# Patient Record
Sex: Female | Born: 1965 | Race: Black or African American | Hispanic: No | Smoking: Former smoker
Health system: Southern US, Community
[De-identification: ages and names within clinical notes are randomized; demographics above are authoritative.]

## PROBLEM LIST (undated history)

## (undated) DIAGNOSIS — G4700 Insomnia, unspecified: Secondary | ICD-10-CM

## (undated) DIAGNOSIS — F419 Anxiety disorder, unspecified: Secondary | ICD-10-CM

## (undated) DIAGNOSIS — M549 Dorsalgia, unspecified: Secondary | ICD-10-CM

## (undated) DIAGNOSIS — N809 Endometriosis, unspecified: Secondary | ICD-10-CM

## (undated) HISTORY — DX: Insomnia, unspecified: G47.00

## (undated) HISTORY — DX: Endometriosis, unspecified: N80.9

## (undated) HISTORY — PX: ABDOMINAL HYSTERECTOMY: SHX81

## (undated) HISTORY — PX: APPENDECTOMY: SHX54

## (undated) HISTORY — DX: Anxiety disorder, unspecified: F41.9

---

## 2007-03-15 ENCOUNTER — Emergency Department (HOSPITAL_COMMUNITY): Admission: EM | Admit: 2007-03-15 | Discharge: 2007-03-15 | Payer: Self-pay | Admitting: Emergency Medicine

## 2007-03-29 ENCOUNTER — Emergency Department (HOSPITAL_COMMUNITY): Admission: EM | Admit: 2007-03-29 | Discharge: 2007-03-29 | Payer: Self-pay | Admitting: Emergency Medicine

## 2007-04-22 ENCOUNTER — Emergency Department (HOSPITAL_COMMUNITY): Admission: EM | Admit: 2007-04-22 | Discharge: 2007-04-22 | Payer: Self-pay | Admitting: Emergency Medicine

## 2008-05-24 ENCOUNTER — Emergency Department (HOSPITAL_COMMUNITY): Admission: EM | Admit: 2008-05-24 | Discharge: 2008-05-24 | Payer: Self-pay | Admitting: Emergency Medicine

## 2008-06-27 ENCOUNTER — Emergency Department (HOSPITAL_COMMUNITY): Admission: EM | Admit: 2008-06-27 | Discharge: 2008-06-27 | Payer: Self-pay | Admitting: Emergency Medicine

## 2009-03-28 ENCOUNTER — Emergency Department (HOSPITAL_COMMUNITY): Admission: EM | Admit: 2009-03-28 | Discharge: 2009-03-28 | Payer: Self-pay | Admitting: Emergency Medicine

## 2011-01-14 ENCOUNTER — Other Ambulatory Visit: Payer: Self-pay | Admitting: Family Medicine

## 2011-01-14 DIAGNOSIS — Z1231 Encounter for screening mammogram for malignant neoplasm of breast: Secondary | ICD-10-CM

## 2011-01-21 ENCOUNTER — Ambulatory Visit (HOSPITAL_COMMUNITY): Payer: Self-pay | Attending: Family Medicine

## 2011-07-06 ENCOUNTER — Emergency Department (HOSPITAL_COMMUNITY)
Admission: EM | Admit: 2011-07-06 | Discharge: 2011-07-07 | Disposition: A | Discharge status: Home / Self Care | Payer: Self-pay | Attending: Emergency Medicine | Admitting: Emergency Medicine

## 2011-07-06 DIAGNOSIS — K089 Disorder of teeth and supporting structures, unspecified: Secondary | ICD-10-CM | POA: Insufficient documentation

## 2011-07-06 DIAGNOSIS — Z87891 Personal history of nicotine dependence: Secondary | ICD-10-CM | POA: Insufficient documentation

## 2012-01-19 ENCOUNTER — Ambulatory Visit: Payer: BC Managed Care – PPO

## 2012-01-19 ENCOUNTER — Ambulatory Visit (INDEPENDENT_AMBULATORY_CARE_PROVIDER_SITE_OTHER): Payer: BC Managed Care – PPO | Admitting: Physician Assistant

## 2012-01-19 DIAGNOSIS — H66009 Acute suppurative otitis media without spontaneous rupture of ear drum, unspecified ear: Secondary | ICD-10-CM

## 2012-01-19 DIAGNOSIS — H6690 Otitis media, unspecified, unspecified ear: Secondary | ICD-10-CM

## 2012-01-19 DIAGNOSIS — R091 Pleurisy: Secondary | ICD-10-CM

## 2012-01-19 MED ORDER — IBUPROFEN 600 MG PO TABS: 600.0000 mg | ORAL_TABLET | Freq: Four times a day (QID) | ORAL | Status: AC | PRN

## 2012-01-19 MED ORDER — AZITHROMYCIN 250 MG PO TABS: ORAL_TABLET | ORAL | Status: AC

## 2012-01-19 MED ORDER — HYDROCODONE-ACETAMINOPHEN 5-500 MG PO TABS: 1.0000 | ORAL_TABLET | Freq: Three times a day (TID) | ORAL | Status: AC | PRN

## 2012-01-19 NOTE — Progress Notes (Signed)
  Subjective:    Patient ID: Rebecca Grant, female    DOB: 03/11/1966, 46 y.o.   MRN: [id number]  HPI 46 y/o BF presents with three day history of left thoracic back pain.  No known injury.  Increased pain with inspiration and movement.  Denies dyspnea or wheeze.  No fever.  No recent travel.  No LE edema or pain.  Non smoker (quit 2012), not on OCPs.  S/p hysterectomy for fibroid tumors.  Had viral illness, presumed influenza, two weeks ago with cough and fever, now resolved.    Also with five day history of left sided ear pain.  Shooting pain, sensation of fullness.     Review of Systems  Constitutional: Positive for fatigue (ongoing since influenza dx two weeks ago). Negative for fever and chills.  HENT: Positive for ear pain. Negative for nosebleeds, congestion, rhinorrhea and tinnitus.   Respiratory: Negative for cough, shortness of breath and wheezing. Chest tightness: left thoracic back pain.   Cardiovascular: Negative for chest pain (left rear thoracic back pain) and palpitations.  Genitourinary: Negative for dysuria, frequency and flank pain.  Musculoskeletal: Positive for back pain (left rear thoracic).       Objective:   Physical Exam  Constitutional: She is oriented to person, place, and time. No distress (but with obvious discomfort on inspiration).  HENT:  Head: Normocephalic.  Left Ear: Tympanic membrane is bulging (multiple bullous lesions). A middle ear effusion (purulent) is present.  Neck: Normal range of motion.  Cardiovascular: Normal rate and regular rhythm.  Exam reveals no gallop and no friction rub.   No murmur heard. Pulmonary/Chest: No respiratory distress. She has no wheezes. She has no rales.  Musculoskeletal: She exhibits tenderness (left rear thoracic rib cage at T9-T11).  Neurological: She is alert and oriented to person, place, and time.  Skin: Skin is warm and dry.    CXR without obvious infiltrate or fracture.  No wedge seen.     Assessment & Plan:    Left OM and bullous myringitis.  Treat with azithromycin.  Ibuprofen prn pain. Pleuritic chest pain.  Viral illness resolving, no cough, normal pulse ox, stable BP and pulse.  Symptomatic and supportive care with ibuprofen 600mg  QID, heat and rest.

## 2012-01-19 NOTE — Patient Instructions (Signed)
Pleurisy  Pleurisy is an inflammation and swelling of the lining of the lungs. It usually is the result of an underlying infection or other disease. Because of this inflammation, it hurts to breathe. It is aggravated by coughing or deep breathing. The primary goal in treating pleurisy is to diagnose and treat the condition that caused it.   HOME CARE INSTRUCTIONS    Only take over-the-counter or prescription medicines for pain, discomfort, or fever as directed by your caregiver.   If medications which kill germs (antibiotics) were prescribed, take the entire course. Even if you are feeling better, you need to take them.   Use a cool mist vaporizer to help loosen secretions. This is so the secretions can be coughed up more easily.  SEEK MEDICAL CARE IF:    Your pain is not controlled with medication or is increasing.   You have an increase inpus like (purulent) secretions brought up with coughing.  SEEK IMMEDIATE MEDICAL CARE IF:    You have blue or dark lips, fingernails, or toenails.   You begin coughing up blood.   You have increased difficulty breathing.   You have continuing pain unrelieved by medicine or lasting more than 1 week.   You have pain that radiates into your neck, arms, or jaw.   You develop increased shortness of breath or wheezing.   You develop a fever, rash, vomiting, fainting, or other serious complaints.  Document Released: 10/04/2005 Document Revised: 09/23/2011 Document Reviewed: 05/05/2007  Exit[employer] Patient Information 2012 [employer].

## 2012-06-04 ENCOUNTER — Emergency Department (HOSPITAL_COMMUNITY)
Admission: EM | Admit: 2012-06-04 | Discharge: 2012-06-04 | Disposition: A | Discharge status: Home / Self Care | Payer: Self-pay | Attending: Emergency Medicine | Admitting: Emergency Medicine

## 2012-06-04 ENCOUNTER — Encounter (HOSPITAL_COMMUNITY): Payer: Self-pay | Admitting: *Deleted

## 2012-06-04 DIAGNOSIS — H9209 Otalgia, unspecified ear: Secondary | ICD-10-CM | POA: Insufficient documentation

## 2012-06-04 DIAGNOSIS — M26629 Arthralgia of temporomandibular joint, unspecified side: Secondary | ICD-10-CM

## 2012-06-04 DIAGNOSIS — H9202 Otalgia, left ear: Secondary | ICD-10-CM

## 2012-06-04 DIAGNOSIS — M26609 Unspecified temporomandibular joint disorder, unspecified side: Secondary | ICD-10-CM | POA: Insufficient documentation

## 2012-06-04 MED ORDER — ANTIPYRINE-BENZOCAINE 5.4-1.4 % OT SOLN
3.0000 [drp] | OTIC | Status: DC | PRN
  Administered 2012-06-04: 3 [drp] via OTIC
  Filled 2012-06-04: qty 10

## 2012-06-04 MED ORDER — HYDROCODONE-ACETAMINOPHEN 5-325 MG PO TABS
1.0000 | ORAL_TABLET | Freq: Once | ORAL | Status: AC
  Administered 2012-06-04: 1 via ORAL
  Filled 2012-06-04: qty 1

## 2012-06-04 MED ORDER — HYDROCODONE-ACETAMINOPHEN 5-325 MG PO TABS: 1.0000 | ORAL_TABLET | ORAL | Status: AC | PRN

## 2012-06-04 NOTE — ED Notes (Signed)
Pt c/o left earache/infection; was given amoxicillin about a wk ago and her ear is no better

## 2012-06-04 NOTE — ED Provider Notes (Signed)
History     CSN: [id number]  Arrival date & time 06/04/12  2016   First MD Initiated Contact with Patient 06/04/12 2151      Chief Complaint  Patient presents with  . Otalgia   HPI  History provided by the patient. Patient is a 46 year old female with no significant PMH who presents with complaints of persistent left ear pain. Patient states she has been having persistent pains the left ear for the past 5-6 days. Pain radiates into the left face and head. Patient was seen by PCP for these symptoms diagnosed with an infection and given prescriptions for amoxicillin and tramadol. Patient has also been using high-dose ibuprofen during the day for symptoms without any significant improvement. Patient states medicines are not helping with her pain symptoms. She denies any aggravating or alleviating factors. She denies any associated nasal congestion, rhinorrhea, cough, sore throat or dental pain.    History reviewed. No pertinent past medical history.  Past Surgical History  Procedure Date  . Abdominal hysterectomy     No family history on file.  History  Substance Use Topics  . Smoking status: Never Smoker   . Smokeless tobacco: Not on file  . Alcohol Use: Not on file    OB History    Grav Para Term Preterm Abortions TAB SAB Ect Mult Living                  Review of Systems  Constitutional: Negative for fever and chills.  HENT: Positive for ear pain. Negative for hearing loss, congestion, rhinorrhea, dental problem and tinnitus.   Respiratory: Negative for cough.   Gastrointestinal: Negative for nausea and vomiting.  Neurological: Positive for headaches.    Allergies  Review of patient's allergies indicates no known allergies.  Home Medications   Current Outpatient Rx  Name Route Sig Dispense Refill  . AMOXICILLIN 500 MG PO CAPS Oral Take 500 mg by mouth 3 (three) times daily.    . IBUPROFEN 800 MG PO TABS Oral Take 800 mg by mouth every 8 (eight) hours as needed.  pain    . TRAMADOL HCL 50 MG PO TABS Oral Take 50 mg by mouth every 6 (six) hours as needed. pain      BP 108/68  Pulse 93  Temp 98 F (36.7 C) (Oral)  Resp 20  SpO2 99%  Physical Exam  Nursing note and vitals reviewed. Constitutional: She is oriented to person, place, and time. She appears well-developed and well-nourished. No distress.  HENT:  Head: Normocephalic and atraumatic.  Right Ear: Tympanic membrane normal.  Left Ear: Tympanic membrane normal.  Mouth/Throat: Oropharynx is clear and moist.       Some pain with manipulation of left pinna.  There is also tenderness over left TMJ area with slight clicking. No pain over dentition. No oral swelling.  Neck: Normal range of motion. Neck supple.  Cardiovascular: Normal rate and regular rhythm.   Pulmonary/Chest: Effort normal and breath sounds normal.  Lymphadenopathy:    She has no cervical adenopathy.  Neurological: She is alert and oriented to person, place, and time.  Skin: Skin is warm and dry. No rash noted.  Psychiatric: She has a normal mood and affect. Her behavior is normal.    ED Course  Procedures     1. Otalgia of left ear   2. TMJ tenderness       MDM  10:00PM patient seen and evaluated. No impressive findings on examination of the year. Patient  is tender with manipulations of the pinna of ear.  Patient also has tenderness over left TMJ area. There is slight clicking. She denies increased pain or symptoms with opening and closing the jaw.  Patient given Auralgan eardrops as well as dose of Norco. She has been instructed to followup with her PCP tomorrow for continued evaluation and treatment.      Angus Seller, [place] 06/04/12 2253

## 2012-06-05 NOTE — ED Provider Notes (Signed)
Medical screening examination/treatment/procedure(s) were performed by non-physician practitioner and as supervising physician I was immediately available for consultation/collaboration.  Eagle Pitta, MD 06/05/12 0126 

## 2012-06-22 ENCOUNTER — Ambulatory Visit: Payer: BC Managed Care – PPO | Admitting: Emergency Medicine

## 2012-06-22 VITALS — BP 110/76 | HR 80 | Temp 97.8°F | Resp 18 | Ht 67.0 in | Wt 202.0 lb

## 2012-06-22 DIAGNOSIS — M26609 Unspecified temporomandibular joint disorder, unspecified side: Secondary | ICD-10-CM

## 2012-06-22 MED ORDER — NAPROXEN SODIUM 550 MG PO TABS: 550.0000 mg | ORAL_TABLET | Freq: Two times a day (BID) | ORAL | Status: DC

## 2012-06-22 MED ORDER — DIAZEPAM 2 MG PO TABS: 2.0000 mg | ORAL_TABLET | Freq: Four times a day (QID) | ORAL | Status: AC | PRN

## 2012-06-22 NOTE — Progress Notes (Signed)
   Date:  06/22/2012   Name:  Rebecca Grant   DOB:  11-04-1965   MRN:  [id number] Gender: female Age: 46 y.o.  PCP:  No primary provider on file.    Chief Complaint: Otalgia   History of Present Illness:  Rebecca Grant is a 46 y.o. pleasant patient who presents with the following:  Left ear pain and pain into left TMJ.  Seen in ER for same last month and put on vicodin and amoxicillin.  There was no improvement in her symptoms.  She has no nasal congestion, drainage or post nasal drip. Has no fever or chills, sore throat or cough.  Says the pain is worse at night and wakes her up.  She has some relief sitting up but the pain is spamodic and cycles so she wakes up frequently.  She feels as though she is gritting her teeth.  There is no problem list on file for this patient.   No past medical history on file.  Past Surgical History  Procedure Date  . Abdominal hysterectomy     History  Substance Use Topics  . Smoking status: Former Smoker -- 0.3 packs/day for 6 years    Quit date: 06/21/2011  . Smokeless tobacco: Never Used  . Alcohol Use: No    No family history on file.  No Known Allergies  Medication list has been reviewed and updated.  Current Outpatient Prescriptions on File Prior to Visit  Medication Sig Dispense Refill  . ibuprofen (ADVIL,MOTRIN) 800 MG tablet Take 800 mg by mouth every 8 (eight) hours as needed. pain      . traMADol (ULTRAM) 50 MG tablet Take 50 mg by mouth every 6 (six) hours as needed. pain        Review of Systems:  As per HPI, otherwise negative.    Physical Examination: Filed Vitals:   06/22/12 1432  BP: 110/76  Pulse: 80  Temp: 97.8 F (36.6 C)  Resp: 18   Filed Vitals:   06/22/12 1432  Height: 5\' 7"  (1.702 m)  Weight: 202 lb (91.627 kg)   Body mass index is 31.64 kg/(m^2). Ideal Body Weight: Weight in (lb) to have BMI = 25: 159.3    GEN: WDWN, NAD, Non-toxic, Alert & Oriented x 3 HEENT: Atraumatic, Normocephalic.   Tender  over left TMJ.  No crepitus or cellulitis.  Oropharynx negative Ears and Nose: No external deformity. TM negative. EXTR: No clubbing/cyanosis/edema NEURO: Normal gait.  PSYCH: Normally interactive. Conversant. Not depressed or anxious appearing.  Calm demeanor.  Neck supple   Assessment and Plan: TMJ dysfunction.  Mouth guard Follow up with dentist Anaprox valium  Carmelina Dane, MD

## 2013-01-15 ENCOUNTER — Ambulatory Visit (INDEPENDENT_AMBULATORY_CARE_PROVIDER_SITE_OTHER): Payer: BC Managed Care – PPO | Admitting: Emergency Medicine

## 2013-01-15 VITALS — BP 112/72 | HR 79 | Temp 98.1°F | Resp 16 | Ht 66.0 in | Wt 208.0 lb

## 2013-01-15 DIAGNOSIS — B372 Candidiasis of skin and nail: Secondary | ICD-10-CM

## 2013-01-15 MED ORDER — FLUCONAZOLE 100 MG PO TABS: ORAL_TABLET | ORAL | Status: DC

## 2013-01-15 NOTE — Patient Instructions (Addendum)
Candida Infection, Adult  A candida infection (also called yeast, fungus and Monilia infection) is an overgrowth of yeast that can occur anywhere on the body. A yeast infection commonly occurs in warm, moist body areas. Usually, the infection remains localized but can spread to become a systemic infection. A yeast infection may be a sign of a more severe disease such as diabetes, leukemia, or AIDS.  A yeast infection can occur in both men and women. In women, Candida vaginitis is a vaginal infection. It is one of the most common causes of vaginitis. Men usually do not have symptoms or know they have an infection until other problems develop. Men may find out they have a yeast infection because their sex partner has a yeast infection. Uncircumcised men are more likely to get a yeast infection than circumcised men. This is because the uncircumcised glans is not exposed to air and does not remain as dry as that of a circumcised glans. Older adults may develop yeast infections around dentures.  CAUSES   Women   Antibiotics.   Steroid medication taken for a long time.   Being overweight (obese).   Diabetes.   Poor immune condition.   Certain serious medical conditions.   Immune suppressive medications for organ transplant patients.   Chemotherapy.   Pregnancy.   Menstration.   Stress and fatigue.   Intravenous drug use.   Oral contraceptives.   Wearing tight-fitting clothes in the crotch area.   Catching it from a sex partner who has a yeast infection.   Spermicide.   Intravenous, urinary, or other catheters.  Men   Catching it from a sex partner who has a yeast infection.   Having oral or anal sex with a person who has the infection.   Spermicide.   Diabetes.   Antibiotics.   Poor immune system.   Medications that suppress the immune system.   Intravenous drug use.   Intravenous, urinary, or other catheters.  SYMPTOMS   Women   Thick, white vaginal discharge.   Vaginal itching.   Redness and  swelling in and around the vagina.   Irritation of the lips of the vagina and perineum.   Blisters on the vaginal lips and perineum.   Painful sexual intercourse.   Low blood sugar (hypoglycemia).   Painful urination.   Bladder infections.   Intestinal problems such as constipation, indigestion, bad breath, bloating, increase in gas, diarrhea, or loose stools.  Men   Men may develop intestinal problems such as constipation, indigestion, bad breath, bloating, increase in gas, diarrhea, or loose stools.   Dry, cracked skin on the penis with itching or discomfort.   Jock itch.   Dry, flaky skin.   Athlete's foot.   Hypoglycemia.  DIAGNOSIS   Women   A history and an exam are performed.   The discharge may be examined under a microscope.   A culture may be taken of the discharge.  Men   A history and an exam are performed.   Any discharge from the penis or areas of cracked skin will be looked at under the microscope and cultured.   Stool samples may be cultured.  TREATMENT   Women   Vaginal antifungal suppositories and creams.   Medicated creams to decrease irritation and itching on the outside of the vagina.   Warm compresses to the perineal area to decrease swelling and discomfort.   Oral antifungal medications.   Medicated vaginal suppositories or cream for repeated or recurrent infections.     Wash and dry the irritation areas before applying the cream.   Eating yogurt with lactobacillus may help with prevention and treatment.   Sometimes painting the vagina with gentian violet solution may help if creams and suppositories do not work.  Men   Antifungal creams and oral antifungal medications.   Sometimes treatment must continue for 30 days after the symptoms go away to prevent recurrence.  HOME CARE INSTRUCTIONS   Women   Use cotton underwear and avoid tight-fitting clothing.   Avoid colored, scented toilet paper and deodorant tampons or pads.   Do not douche.   Keep your diabetes  under control.   Finish all the prescribed medications.   Keep your skin clean and dry.   Consume milk or yogurt with lactobacillus active culture regularly. If you get frequent yeast infections and think that is what the infection is, there are over-the-counter medications that you can get. If the infection does not show healing in 3 days, talk to your caregiver.   Tell your sex partner you have a yeast infection. Your partner may need treatment also, especially if your infection does not clear up or recurs.  Men   Keep your skin clean and dry.   Keep your diabetes under control.   Finish all prescribed medications.   Tell your sex partner that you have a yeast infection so they can be treated if necessary.  SEEK MEDICAL CARE IF:    Your symptoms do not clear up or worsen in one week after treatment.   You have an oral temperature above 102 F (38.9 C).   You have trouble swallowing or eating for a prolonged time.   You develop blisters on and around your vagina.   You develop vaginal bleeding and it is not your menstrual period.   You develop abdominal pain.   You develop intestinal problems as mentioned above.   You get weak or lightheaded.   You have painful or increased urination.   You have pain during sexual intercourse.  MAKE SURE YOU:    Understand these instructions.   Will watch your condition.   Will get help right away if you are not doing well or get worse.  Document Released: 11/11/2004 Document Revised: 12/27/2011 Document Reviewed: 02/23/2010  [employer] Patient Information 2013 [employer].

## 2013-01-15 NOTE — Progress Notes (Signed)
[facility] [address] [place] [address] [phone]  Date:  01/15/2013   Name:  Rebecca Grant   DOB:  [birth year]   MRN:  [id number]  PCP:  No primary provider on file.    Chief Complaint: Rash and Medication Refill   History of Present Illness:  Freida Grant is a 47 y.o. very pleasant female patient who presents with the following:  Has a rash in both inframammary folds worse on right.  No history of allergen contact.  Pruritic at night.  No improvement with over the counter medications or other home remedies. Denies other complaint or health concern today.   There is no problem list on file for this patient.   History reviewed. No pertinent past medical history.  Past Surgical History  Procedure Laterality Date  . Abdominal hysterectomy    . Appendectomy      History  Substance Use Topics  . Smoking status: Former Smoker -- 0.30 packs/day for 6 years    Quit date: 06/21/2011  . Smokeless tobacco: Never Used  . Alcohol Use: No    Family History  Problem Relation Age of Onset  . Hypertension Mother     No Known Allergies  Medication list has been reviewed and updated.  Current Outpatient Prescriptions on File Prior to Visit  Medication Sig Dispense Refill  . HYDROcodone-acetaminophen (VICODIN) 5-500 MG per tablet Take 1 tablet by mouth every 6 (six) hours as needed.      Marland Kitchen ibuprofen (ADVIL,MOTRIN) 800 MG tablet Take 800 mg by mouth every 8 (eight) hours as needed. pain      . naproxen sodium (ANAPROX DS) 550 MG tablet Take 1 tablet (550 mg total) by mouth 2 (two) times daily with a meal.  40 tablet  0  . traMADol (ULTRAM) 50 MG tablet Take 50 mg by mouth every 6 (six) hours as needed. pain       No current facility-administered medications on file prior to visit.    Review of Systems:  As per HPI, otherwise negative.    Physical Examination: Filed Vitals:   01/15/13 1208  BP: 112/72  Pulse: 79  Temp: 98.1 F (36.7 C)  Resp:  16   Filed Vitals:   01/15/13 1208  Height: 5\' 6"  (1.676 m)  Weight: 208 lb (94.348 kg)   Body mass index is 33.59 kg/(m^2). Ideal Body Weight: Weight in (lb) to have BMI = 25: 154.6   GEN: WDWN, NAD, Non-toxic, Alert & Oriented x 3 HEENT: Atraumatic, Normocephalic.  Ears and Nose: No external deformity. EXTR: No clubbing/cyanosis/edema NEURO: Normal gait.  PSYCH: Normally interactive. Conversant. Not depressed or anxious appearing.  Calm demeanor.  SKIN:  Inframammary candidias   Assessment and Plan: Candidal intertrigo Diflucan   Signed,  Phillips Odor, MD

## 2013-03-08 ENCOUNTER — Encounter: Payer: Self-pay | Admitting: Physician Assistant

## 2013-03-08 ENCOUNTER — Telehealth: Payer: Self-pay | Admitting: Family Medicine

## 2013-03-08 ENCOUNTER — Ambulatory Visit (INDEPENDENT_AMBULATORY_CARE_PROVIDER_SITE_OTHER): Payer: BC Managed Care – PPO | Admitting: Physician Assistant

## 2013-03-08 VITALS — BP 120/72 | HR 93 | Temp 98.0°F | Resp 16 | Ht 67.0 in | Wt 213.6 lb

## 2013-03-08 DIAGNOSIS — F520 Hypoactive sexual desire disorder: Secondary | ICD-10-CM

## 2013-03-08 DIAGNOSIS — L259 Unspecified contact dermatitis, unspecified cause: Secondary | ICD-10-CM

## 2013-03-08 DIAGNOSIS — Z1239 Encounter for other screening for malignant neoplasm of breast: Secondary | ICD-10-CM

## 2013-03-08 DIAGNOSIS — G4700 Insomnia, unspecified: Secondary | ICD-10-CM | POA: Insufficient documentation

## 2013-03-08 DIAGNOSIS — Z1211 Encounter for screening for malignant neoplasm of colon: Secondary | ICD-10-CM

## 2013-03-08 DIAGNOSIS — F419 Anxiety disorder, unspecified: Secondary | ICD-10-CM

## 2013-03-08 DIAGNOSIS — J309 Allergic rhinitis, unspecified: Secondary | ICD-10-CM

## 2013-03-08 DIAGNOSIS — Z0000 Encounter for general adult medical examination without abnormal findings: Secondary | ICD-10-CM

## 2013-03-08 DIAGNOSIS — F411 Generalized anxiety disorder: Secondary | ICD-10-CM

## 2013-03-08 DIAGNOSIS — R6882 Decreased libido: Secondary | ICD-10-CM

## 2013-03-08 DIAGNOSIS — E8940 Asymptomatic postprocedural ovarian failure: Secondary | ICD-10-CM

## 2013-03-08 DIAGNOSIS — L309 Dermatitis, unspecified: Secondary | ICD-10-CM

## 2013-03-08 DIAGNOSIS — Z23 Encounter for immunization: Secondary | ICD-10-CM

## 2013-03-08 LAB — POCT UA - MICROSCOPIC ONLY
Bacteria, U Microscopic: NEGATIVE
Casts, Ur, LPF, POC: NEGATIVE
Yeast, UA: NEGATIVE

## 2013-03-08 LAB — COMPREHENSIVE METABOLIC PANEL
AST: 18 U/L (ref 0–37)
Albumin: 4.2 g/dL (ref 3.5–5.2)
Alkaline Phosphatase: 79 U/L (ref 39–117)
BUN: 9 mg/dL (ref 6–23)
Potassium: 4.4 mEq/L (ref 3.5–5.3)
Sodium: 139 mEq/L (ref 135–145)
Total Bilirubin: 0.4 mg/dL (ref 0.3–1.2)

## 2013-03-08 LAB — POCT URINALYSIS DIPSTICK
Bilirubin, UA: NEGATIVE
Blood, UA: NEGATIVE
Leukocytes, UA: NEGATIVE
Nitrite, UA: NEGATIVE
Urobilinogen, UA: 1
pH, UA: 7

## 2013-03-08 LAB — CBC WITH DIFFERENTIAL/PLATELET
Basophils Absolute: 0 10*3/uL (ref 0.0–0.1)
Basophils Relative: 0 % (ref 0–1)
MCHC: 33.7 g/dL (ref 30.0–36.0)
Monocytes Absolute: 0.4 10*3/uL (ref 0.1–1.0)
Neutro Abs: 2.4 10*3/uL (ref 1.7–7.7)
Neutrophils Relative %: 43 % (ref 43–77)
RDW: 13.4 % (ref 11.5–15.5)

## 2013-03-08 LAB — TSH: TSH: 0.423 u[IU]/mL (ref 0.350–4.500)

## 2013-03-08 LAB — LIPID PANEL
HDL: 38 mg/dL — ABNORMAL LOW (ref >39)
LDL Cholesterol: 116 mg/dL — ABNORMAL HIGH (ref 0–99)
VLDL: 17 mg/dL (ref 0–40)

## 2013-03-08 MED ORDER — ESTROGENS CONJUGATED 0.3 MG PO TABS: 0.3000 mg | ORAL_TABLET | Freq: Every day | ORAL | Status: DC

## 2013-03-08 MED ORDER — FLUTICASONE PROPIONATE 50 MCG/ACT NA SUSP: 2.0000 | Freq: Every day | NASAL | Status: DC

## 2013-03-08 MED ORDER — LORAZEPAM 0.5 MG PO TABS: 0.5000 mg | ORAL_TABLET | Freq: Every evening | ORAL | Status: DC | PRN

## 2013-03-08 MED ORDER — CLOBETASOL PROPIONATE 0.05 % EX CREA: TOPICAL_CREAM | Freq: Two times a day (BID) | CUTANEOUS | Status: DC

## 2013-03-08 NOTE — Patient Instructions (Addendum)
I will contact you with your lab results as soon as they are available.   If you have not heard from me in 2 weeks, please contact me.  The fastest way to get your results is to register for My Chart (see the instructions on the last page of this printout).  Keeping You Healthy  Get These Tests 1. Blood Pressure- Have your blood pressure checked once a year by your health care provider.  Normal blood pressure is 120/80. 2. Weight- Have your body mass index (BMI) calculated to screen for obesity.  BMI is measure of body fat based on height and weight.  You can also calculate your own BMI at www.nhlbisupport.com/bmi/. 3. Cholesterol- Have your cholesterol checked every 5 years starting at age 20 then yearly starting at age 45. 4. Chlamydia, HIV, and other sexually transmitted diseases- Get screened every year until age 25, then within three months of each new sexual provider. 5. Pap Smear- Every 1-3 years; discuss with your health care provider. 6. Mammogram- Every year starting at age 40  Take these medicines  Calcium with Vitamin D-Your body needs 1200 mg of Calcium each day and 800-1000 IU of Vitamin D daily.  Your body can only absorb 500 mg of Calcium at a time so Calcium must be taken in 2 or 3 divided doses throughout the day.  Multivitamin with folic acid- Once daily if it is possible for you to become pregnant.  Get these Immunizations  Gardasil-Series of three doses; prevents HPV related illness such as genital warts and cervical cancer.  Menactra-Single dose; prevents meningitis.  Tetanus shot- Every 10 years.  Flu shot-Every year.  Take these steps 1. Do not smoke-Your healthcare provider can help you quit.  For tips on how to quit go to www.smokefree.gov or call 1-800 QUITNOW. 2. Be physically active- Exercise 5 days a week for at least 30 minutes.  If you are not already physically active, start slow and gradually work up to 30 minutes of moderate physical activity.   Examples of moderate activity include walking briskly, dancing, swimming, bicycling, etc. 3. Breast Cancer- A self breast exam every month is important for early detection of breast cancer.  For more information and instruction on self breast exams, ask your healthcare provider or www.womenshealth.gov/faq/breast-self-exam.cfm. 4. Eat a healthy diet- Eat a variety of healthy foods such as fruits, vegetables, whole grains, low fat milk, low fat cheeses, yogurt, lean meats, poultry and fish, beans, nuts, tofu, etc.  For more information go to www. Thenutritionsource.org 5. Drink alcohol in moderation- Limit alcohol intake to one drink or less per day. Never drink and drive. 6. Depression- Your emotional health is as important as your physical health.  If you're feeling down or losing interest in things you normally enjoy please talk to your healthcare provider about being screened for depression. 7. Dental visit- Brush and floss your teeth twice daily; visit your dentist twice a year. 8. Eye doctor- Get an eye exam at least every 2 years. 9. Helmet use- Always wear a helmet when riding a bicycle, motorcycle, rollerblading or skateboarding. 10. Safe sex- If you may be exposed to sexually transmitted infections, use a condom. 11. Seat belts- Seat belts can save your live; always wear one. 12. Smoke/Carbon Monoxide detectors- These detectors need to be installed on the appropriate level of your home. Replace batteries at least once a year. 13. Skin cancer- When out in the sun please cover up and use sunscreen 15 SPF or higher.   14. Violence- If anyone is threatening or hurting you, please tell your healthcare provider.        

## 2013-03-08 NOTE — Progress Notes (Signed)
Subjective:    Patient ID: Rebecca Grant, female    DOB: Sep 06, 1966, 47 y.o.   MRN: [id number]  HPI This 47 y.o. female presents for Annual Wellness Exam.   Past Medical History  Diagnosis Date  . Anxiety   . Insomnia   . Endometriosis     s/p hysterectomy    Past Surgical History  Procedure Laterality Date  . Abdominal hysterectomy    . Appendectomy      Prior to Admission medications   Medication Sig Start Date End Date Taking? Authorizing Provider  ibuprofen (ADVIL,MOTRIN) 800 MG tablet Take 800 mg by mouth every 8 (eight) hours as needed. pain   Yes Historical Provider, MD  LORazepam (ATIVAN) 0.5 MG tablet Take 1 tablet (0.5 mg total) by mouth at bedtime as needed for anxiety. 03/08/13   Teniola Tseng Tessa Lerner, PA-C    No Known Allergies  History   Social History  . Marital Status: Single    Spouse Name: n/a    Number of Children: 1  . Years of Education: 12   Occupational History  . Technical sales engineer   Social History Main Topics  . Smoking status: Current Every Day Smoker -- 0.30 packs/day for 6 years  . Smokeless tobacco: Never Used     Comment: smokes for stress relief  . Alcohol Use: No  . Drug Use: No  . Sexually Active: No   Other Topics Concern  . Not on file   Social History Narrative   Lives alone.    Family History  Problem Relation Age of Onset  . Hypertension Mother   . Gout Maternal Grandmother   . Heart disease Maternal Grandfather   . Hypertension Maternal Grandfather   . Gout Maternal Grandfather   . Seizures Sister 54    undetermined etiology     Review of Systems  Constitutional: Negative.   HENT: Negative.   Eyes: Negative.   Respiratory: Negative.   Cardiovascular: Negative.   Gastrointestinal: Negative.   Endocrine: Negative.   Genitourinary: Negative.        No interest in sex or physical intimacy.  "I really love my boyfriend.  We've been together for 5 years."  Has been told that her vagina is small, a  result of hysterectomy.  Denies history of painful sex, abuse/non-consensual intimacy.  Has never used hormones since TAH with oophorectomy.  Musculoskeletal: Negative.   Skin: Positive for rash (itchy rash occurs annually in the warmer months.  Has used clobetasol successfully in the past and requests a prescription.).  Allergic/Immunologic: Negative.   Neurological: Negative.   Hematological: Negative.   Psychiatric/Behavioral: Negative.        Objective:   Physical Exam  Vitals reviewed. Constitutional: She is oriented to person, place, and time. Vital signs are normal. She appears well-developed and well-nourished. She is active and cooperative. No distress.  HENT:  Head: Normocephalic and atraumatic.  Right Ear: Hearing, tympanic membrane, external ear and ear canal normal. No foreign bodies.  Left Ear: Hearing, tympanic membrane, external ear and ear canal normal. No foreign bodies.  Nose: Mucosal edema and rhinorrhea present. No nose lacerations, sinus tenderness, nasal deformity, septal deviation or nasal septal hematoma. No epistaxis.  No foreign bodies.  Mouth/Throat: Uvula is midline, oropharynx is clear and moist and mucous membranes are normal. No oral lesions. Normal dentition. No dental abscesses or edematous. No oropharyngeal exudate.  Eyes: Conjunctivae, EOM and lids are normal. Pupils are equal, round, and reactive to light. Right  eye exhibits no discharge. Left eye exhibits no discharge. No scleral icterus.  Fundoscopic exam:      The right eye shows no arteriolar narrowing, no AV nicking, no exudate, no hemorrhage and no papilledema. The right eye shows red reflex.       The left eye shows no arteriolar narrowing, no AV nicking, no exudate, no hemorrhage and no papilledema. The left eye shows red reflex.  Neck: Trachea normal, normal range of motion and full passive range of motion without pain. Neck supple. No spinous process tenderness and no muscular tenderness present.  No mass and no thyromegaly present.  Cardiovascular: Normal rate, regular rhythm, normal heart sounds, intact distal pulses and normal pulses.   Pulmonary/Chest: Effort normal and breath sounds normal. Right breast exhibits no inverted nipple, no mass, no nipple discharge, no skin change and no tenderness. Left breast exhibits no inverted nipple, no mass, no nipple discharge, no skin change and no tenderness. Breasts are symmetrical.  Abdominal: Soft. Normal appearance and bowel sounds are normal. There is no hepatosplenomegaly. There is no tenderness. There is no CVA tenderness. No hernia.  Musculoskeletal: She exhibits no edema and no tenderness.       Cervical back: Normal.       Thoracic back: Normal.       Lumbar back: Normal.  Lymphadenopathy:       Head (right side): No tonsillar, no preauricular, no posterior auricular and no occipital adenopathy present.       Head (left side): No tonsillar, no preauricular, no posterior auricular and no occipital adenopathy present.    She has no cervical adenopathy.       Right: No supraclavicular adenopathy present.       Left: No supraclavicular adenopathy present.  Neurological: She is alert and oriented to person, place, and time. She has normal strength and normal reflexes. No cranial nerve deficit. She exhibits normal muscle tone. Coordination and gait normal.  Skin: Skin is warm, dry and intact. Rash noted. Papular rash: upper outer arms bilaterally. She is not diaphoretic. No cyanosis or erythema. Nails show no clubbing.  Psychiatric: She has a normal mood and affect. Her speech is normal and behavior is normal. Judgment and thought content normal.      Results for orders placed in visit on 03/08/13  POCT UA - MICROSCOPIC ONLY      Result Value Range   WBC, Ur, HPF, POC neg     RBC, urine, microscopic 0-2     Bacteria, U Microscopic neg     Mucus, UA trace     Epithelial cells, urine per micros 0-3     Crystals, Ur, HPF, POC neg      Casts, Ur, LPF, POC neg     Yeast, UA neg    POCT URINALYSIS DIPSTICK      Result Value Range   Color, UA yellow     Clarity, UA clear     Glucose, UA neg     Bilirubin, UA neg     Ketones, UA trace     Spec Grav, UA 1.025     Blood, UA neg     pH, UA 7.0     Protein, UA neg     Urobilinogen, UA 1.0     Nitrite, UA neg     Leukocytes, UA Negative         Assessment & Plan:  Routine general medical examination at a health care facility - Plan: CBC with Differential,  Comprehensive metabolic panel, Lipid panel, POCT UA - Microscopic Only, POCT urinalysis dipstick; Age appropriate anticipatory guidance provided.  Anxiety - Plan: TSH, LORazepam (ATIVAN) 0.5 MG tablet  Lack of libido - Plan: TSH, estrogens, conjugated, (PREMARIN) 0.3 MG tablet  Premature surgical menopause - Plan: estrogens, conjugated, (PREMARIN) 0.3 MG tablet  Need for Tdap vaccination - Plan: Tdap vaccine greater than or equal to 7yo IM  Screening for breast cancer - Plan: MM Digital Screening  Dermatitis - Plan: clobetasol cream (TEMOVATE) 0.05 %  Allergic rhinitis - Plan: fluticasone (FLONASE) 50 MCG/ACT nasal spray  Fernande Bras, PA-C Physician Assistant-Certified [facility]

## 2013-03-08 NOTE — Telephone Encounter (Signed)
Pt was in office today with an appointment but did not get her prescription for the rash on her arm. She would like this called into her pharmacy.

## 2013-03-09 ENCOUNTER — Encounter: Payer: Self-pay | Admitting: Physician Assistant

## 2013-03-20 ENCOUNTER — Ambulatory Visit
Admission: RE | Admit: 2013-03-20 | Discharge: 2013-03-20 | Disposition: A | Discharge status: Home / Self Care | Payer: BC Managed Care – PPO | Source: Ambulatory Visit | Attending: Physician Assistant | Admitting: Physician Assistant

## 2013-03-20 DIAGNOSIS — Z1239 Encounter for other screening for malignant neoplasm of breast: Secondary | ICD-10-CM

## 2013-03-21 ENCOUNTER — Other Ambulatory Visit: Payer: Self-pay | Admitting: Physician Assistant

## 2013-03-21 DIAGNOSIS — R928 Other abnormal and inconclusive findings on diagnostic imaging of breast: Secondary | ICD-10-CM

## 2013-04-05 ENCOUNTER — Ambulatory Visit: Payer: BC Managed Care – PPO | Admitting: Physician Assistant

## 2013-04-26 ENCOUNTER — Other Ambulatory Visit: Payer: BC Managed Care – PPO

## 2013-05-25 ENCOUNTER — Telehealth: Payer: Self-pay | Admitting: Physician Assistant

## 2013-05-25 NOTE — Telephone Encounter (Signed)
Please call this patient. [facility] regarding her mammogram (called and sent certified letter).  It's important that she have additional imaging of her breasts.

## 2013-05-25 NOTE — Telephone Encounter (Signed)
Called her and she indicates she will call the breast center to Schedule this. She will go for the additional views. She was transferred to make an appt. Also.

## 2013-08-23 ENCOUNTER — Ambulatory Visit (INDEPENDENT_AMBULATORY_CARE_PROVIDER_SITE_OTHER): Payer: BC Managed Care – PPO | Admitting: Family Medicine

## 2013-08-23 ENCOUNTER — Other Ambulatory Visit: Payer: Self-pay

## 2013-08-23 VITALS — BP 122/78 | HR 92 | Temp 98.2°F | Resp 18 | Ht 66.5 in | Wt 211.0 lb

## 2013-08-23 DIAGNOSIS — F419 Anxiety disorder, unspecified: Secondary | ICD-10-CM

## 2013-08-23 DIAGNOSIS — F411 Generalized anxiety disorder: Secondary | ICD-10-CM

## 2013-08-23 DIAGNOSIS — R599 Enlarged lymph nodes, unspecified: Secondary | ICD-10-CM

## 2013-08-23 DIAGNOSIS — R29818 Other symptoms and signs involving the nervous system: Secondary | ICD-10-CM

## 2013-08-23 DIAGNOSIS — H6590 Unspecified nonsuppurative otitis media, unspecified ear: Secondary | ICD-10-CM

## 2013-08-23 DIAGNOSIS — J309 Allergic rhinitis, unspecified: Secondary | ICD-10-CM

## 2013-08-23 DIAGNOSIS — R591 Generalized enlarged lymph nodes: Secondary | ICD-10-CM

## 2013-08-23 LAB — POCT CBC
HCT, POC: 44.5 % (ref 37.7–47.9)
Hemoglobin: 14 g/dL (ref 12.2–16.2)
Lymph, poc: 2.9 (ref 0.6–3.4)
MCH, POC: 31.5 pg — AB (ref 27–31.2)
MCHC: 31.5 g/dL — AB (ref 31.8–35.4)
MCV: 100 fL — AB (ref 80–97)
MPV: 9.3 fL (ref 0–99.8)
POC MID %: 6.8 %M (ref 0–12)
WBC: 6 10*3/uL (ref 4.6–10.2)

## 2013-08-23 MED ORDER — FLUTICASONE PROPIONATE 50 MCG/ACT NA SUSP: 2.0000 | Freq: Every day | NASAL | Status: DC

## 2013-08-23 MED ORDER — ANTIPYRINE-BENZOCAINE 5.4-1.4 % OT SOLN: 3.0000 [drp] | OTIC | Status: DC | PRN

## 2013-08-23 MED ORDER — TRAMADOL HCL 50 MG PO TABS: 50.0000 mg | ORAL_TABLET | Freq: Four times a day (QID) | ORAL | Status: DC | PRN

## 2013-08-23 MED ORDER — AMOXICILLIN-POT CLAVULANATE 875-125 MG PO TABS: 1.0000 | ORAL_TABLET | Freq: Two times a day (BID) | ORAL | Status: DC

## 2013-08-23 MED ORDER — PSEUDOEPHEDRINE HCL ER 120 MG PO TB12: 120.0000 mg | ORAL_TABLET | Freq: Two times a day (BID) | ORAL | Status: DC | PRN

## 2013-08-23 MED ORDER — LORAZEPAM 0.5 MG PO TABS: 0.5000 mg | ORAL_TABLET | Freq: Every evening | ORAL | Status: DC | PRN

## 2013-08-23 MED ORDER — ACETAMINOPHEN-CODEINE #3 300-30 MG PO TABS: 1.0000 | ORAL_TABLET | Freq: Three times a day (TID) | ORAL | Status: DC | PRN

## 2013-08-23 NOTE — Progress Notes (Signed)
Subjective:    Patient ID: Rebecca Grant, female    DOB: May 12, 1966, 47 y.o.   MRN: [id number] Chief Complaint  Patient presents with  . Headache    waking up out of sleep the past few days with headache   . Otalgia    left ear pain x3 days   . rx refills    lorazepam    HPI For the past 3-4 nights has been waking with HA around left side of head, left ear hurting. Left ear discomfort persisting during the day. HA was severe last night but treated partially with motrin 600mg .  No f/c, is having some nasal congesiton, does clench teetch at night, posterior left jaw tender below ear. No adenopathy but throat feels dry.  No cough, SHoB, CP. Had some left tinnitus last night.  Feels like she hears a tapping inside her ear.   Past Medical History  Diagnosis Date  . Anxiety   . Insomnia   . Endometriosis     s/p hysterectomy   Current Outpatient Prescriptions on File Prior to Visit  Medication Sig Dispense Refill  . clobetasol cream (TEMOVATE) 0.05 % Apply topically 2 (two) times daily.  30 g  0  . fluticasone (FLONASE) 50 MCG/ACT nasal spray Place 2 sprays into the nose daily.  16 g  12  . ibuprofen (ADVIL,MOTRIN) 800 MG tablet Take 800 mg by mouth every 8 (eight) hours as needed. pain      . LORazepam (ATIVAN) 0.5 MG tablet Take 1 tablet (0.5 mg total) by mouth at bedtime as needed for anxiety.  30 tablet  0  . estrogens, conjugated, (PREMARIN) 0.3 MG tablet Take 1 tablet (0.3 mg total) by mouth daily.  90 tablet  3   No current facility-administered medications on file prior to visit.   No Known Allergies    Review of Systems  Constitutional: Positive for fatigue. Negative for fever, chills, diaphoresis, activity change and appetite change.  HENT: Positive for congestion, ear pain, rhinorrhea, sinus pressure, sore throat, tinnitus and trouble swallowing. Negative for dental problem, ear discharge, nosebleeds, postnasal drip, sneezing and voice change.   Eyes: Negative for  discharge and itching.  Respiratory: Negative for cough, chest tightness and shortness of breath.   Cardiovascular: Negative for chest pain.  Gastrointestinal: Negative for nausea, vomiting and abdominal pain.  Musculoskeletal: Negative for neck pain and neck stiffness.  Skin: Negative for rash.  Neurological: Positive for headaches. Negative for dizziness and syncope.  Hematological: Negative for adenopathy.  Psychiatric/Behavioral: Positive for sleep disturbance.      BP 122/78  Pulse 92  Temp(Src) 98.2 F (36.8 C) (Oral)  Resp 18  Ht 5' 6.5" (1.689 m)  Wt 211 lb (95.709 kg)  BMI 33.55 kg/m2  SpO2 99% Objective:   Physical Exam  Constitutional: She is oriented to person, place, and time. She appears well-developed and well-nourished. She does not appear ill. No distress.  HENT:  Head: Normocephalic and atraumatic.  Right Ear: External ear and ear canal normal. Tympanic membrane is injected, erythematous and retracted. A middle ear effusion is present.  Left Ear: External ear and ear canal normal. Tympanic membrane is retracted.  No middle ear effusion.  Nose: Mucosal edema and rhinorrhea present. Right sinus exhibits no maxillary sinus tenderness. Left sinus exhibits frontal sinus tenderness. Left sinus exhibits no maxillary sinus tenderness.  Mouth/Throat: Uvula is midline and mucous membranes are normal. Posterior oropharyngeal erythema present. No oropharyngeal exudate, posterior oropharyngeal edema or tonsillar abscesses.  Eyes: Conjunctivae are normal. Right eye exhibits no discharge. Left eye exhibits no discharge. No scleral icterus.  Neck: Normal range of motion. Neck supple.  Cardiovascular: Normal rate, regular rhythm, normal heart sounds and intact distal pulses.   Pulmonary/Chest: Effort normal and breath sounds normal.  Lymphadenopathy:       Head (right side): Submandibular adenopathy present. No preauricular and no posterior auricular adenopathy present.       Head  (left side): Submandibular adenopathy present. No preauricular and no posterior auricular adenopathy present.    She has cervical adenopathy.       Right cervical: Superficial cervical adenopathy present. No posterior cervical adenopathy present.      Left cervical: Superficial cervical adenopathy present. No posterior cervical adenopathy present.       Right: No supraclavicular adenopathy present.       Left: No supraclavicular adenopathy present.  Neurological: She is alert and oriented to person, place, and time.  Skin: Skin is warm and dry. She is not diaphoretic. No erythema.  Psychiatric: She has a normal mood and affect. Her behavior is normal.          Results for orders placed in visit on 08/23/13  POCT CBC      Result Value Range   WBC 6.0  4.6 - 10.2 K/uL   Lymph, poc 2.9  0.6 - 3.4   POC LYMPH PERCENT 49.1  10 - 50 %L   MID (cbc) 0.4  0 - 0.9   POC MID % 6.8  0 - 12 %M   POC Granulocyte 2.6  2 - 6.9   Granulocyte percent 44.1  37 - 80 %G   RBC 4.45  4.04 - 5.48 M/uL   Hemoglobin 14.0  12.2 - 16.2 g/dL   HCT, POC 16.1  09.6 - 47.9 %   MCV 100.0 (*) 80 - 97 fL   MCH, POC 31.5 (*) 27 - 31.2 pg   MCHC 31.5 (*) 31.8 - 35.4 g/dL   RDW, POC 04.5     Platelet Count, POC 210  142 - 424 K/uL   MPV 9.3  0 - 99.8 fL   Assessment & Plan:   Adenopathy - Plan: POCT CBC  Nonsuppurative otitis media, not specified as acute or chronic - placed on Augmentin x 10d in addition to flonase and sudafed.  Use prn auralgan for pain.  Pt requested oral pain medication which I did not think she really should need as the medication should start providing relief within 2-3d but given rx for tramadol - then reported that tramadol "doesn't work for her" and requested tylenol #3 so few tabs rx'ed to help w/ HA until treatment kicks in.  Anxiety - Plan: LORazepam (ATIVAN) 0.5 MG tablet - refilled - last rx for 30 tabs and no refills was given by PCP Tinnie Gens 6 mos ago so pt clearly using very  infreq and prn. Needs f/u w/ PCP for additional refills.  Allergic rhinitis - Plan: fluticasone (FLONASE) 50 MCG/ACT nasal spray  Meds ordered this encounter  Medications  . LORazepam (ATIVAN) 0.5 MG tablet    Sig: Take 1 tablet (0.5 mg total) by mouth at bedtime as needed for anxiety.    Dispense:  30 tablet    Refill:  0    Order Specific Question:  Supervising Provider    Answer:  DOOLITTLE, ROBERT P [[id number]]  . antipyrine-benzocaine (AURALGAN) otic solution    Sig: Place 3-4 drops into the left ear every 2 (  two) hours as needed for ear pain.    Dispense:  10 mL    Refill:  0  . fluticasone (FLONASE) 50 MCG/ACT nasal spray    Sig: Place 2 sprays into both nostrils daily.    Dispense:  16 g    Refill:  12    Order Specific Question:  Supervising Provider    Answer:  DOOLITTLE, ROBERT P [[id number]]  . pseudoephedrine (SUDAFED 12 HOUR) 120 MG 12 hr tablet    Sig: Take 1 tablet (120 mg total) by mouth every 12 (twelve) hours as needed for congestion.    Dispense:  30 tablet    Refill:  0  . DISCONTD: traMADol (ULTRAM) 50 MG tablet    Sig: Take 1 tablet (50 mg total) by mouth every 6 (six) hours as needed.    Dispense:  20 tablet    Refill:  0  . amoxicillin-clavulanate (AUGMENTIN) 875-125 MG per tablet    Sig: Take 1 tablet by mouth 2 (two) times daily.    Dispense:  20 tablet    Refill:  0  . acetaminophen-codeine (TYLENOL #3) 300-30 MG per tablet    Sig: Take 1 tablet by mouth every 8 (eight) hours as needed for severe pain.    Dispense:  15 tablet    Refill:  0   Norberto Sorenson, MD MPH

## 2013-08-23 NOTE — Patient Instructions (Addendum)
Hot showers or breathing in steam may help loosen the congestion.  Using a netti pot or sinus rinse is also likely to help you feel better and keep this from progressing.  Use the afrin nasal spray as needed throughout the day for 3 days only - do not use longer than this - and use the fluticasone nasal spray every night before bed for at least 2 weeks.  I recommend augmenting with 12 hr sudafed (behind the counter) and generic mucinex to help you move out the congestion.  If no improvement or you are getting worse, come back as you might need a course of steroids but hopefully with all of the above, you can avoid it.  Otitis Media, Adult A middle ear infection is an infection in the space behind the eardrum. The medical name for this is "otitis media." It may happen after a common cold. It is caused by a germ that starts growing in that space. You may feel swollen glands in your neck on the side of the ear infection. HOME CARE INSTRUCTIONS   Take your medicine as directed until it is gone, even if you feel better after the first few days.  Only take over-the-counter or prescription medicines for pain, discomfort, or fever as directed by your caregiver.  Occasional use of a nasal decongestant a couple times per day may help with discomfort and help the eustachian tube to drain better. Follow up with your caregiver in 10 to 14 days or as directed, to be certain that the infection has cleared. Not keeping the appointment could result in a chronic or permanent injury, pain, hearing loss and disability. If there is any problem keeping the appointment, you must call back to this facility for assistance. SEEK IMMEDIATE MEDICAL CARE IF:   You are not getting better in 2 to 3 days.  You have pain that is not controlled with medication.  You feel worse instead of better.  You cannot use the medication as directed.  You develop swelling, redness or pain around the ear or stiffness in your neck. MAKE  SURE YOU:   Understand these instructions.  Will watch your condition.  Will get help right away if you are not doing well or get worse. Document Released: 07/09/2004 Document Revised: 12/27/2011 Document Reviewed: 05/01/2013 [facility] Patient Information 2014 [place], [place].

## 2013-09-09 ENCOUNTER — Other Ambulatory Visit: Payer: Self-pay | Admitting: Family Medicine

## 2013-09-15 ENCOUNTER — Telehealth: Payer: Self-pay

## 2013-09-15 NOTE — Telephone Encounter (Signed)
Pharmacy request refill on Tylenol 3 #15. Please advise

## 2013-09-15 NOTE — Telephone Encounter (Signed)
No, this was for acute problem only. If she needs refills on a controlled medication then she needs to come in for an OV.

## 2013-09-16 NOTE — Telephone Encounter (Signed)
Spoke with pt advised Rx for Tylenol 3 denied. Pt wanted to see if she could get a refill on Amitriptyline?

## 2013-09-16 NOTE — Telephone Encounter (Signed)
LMOM to CB. 

## 2013-09-16 NOTE — Telephone Encounter (Signed)
No has never been prescribed amitriptyline here prior so would need OV. If she is referring to her ativan than she is using it more freq than prescribed and much more freq than prior so needs OV for repeat eval for her worsening anxiety.

## 2013-09-18 NOTE — Telephone Encounter (Signed)
Left another message, needs appt.

## 2013-12-04 ENCOUNTER — Ambulatory Visit (INDEPENDENT_AMBULATORY_CARE_PROVIDER_SITE_OTHER): Payer: BC Managed Care – PPO | Admitting: Physician Assistant

## 2013-12-04 VITALS — BP 130/82 | HR 90 | Temp 97.8°F | Resp 16 | Ht 65.0 in | Wt 211.4 lb

## 2013-12-04 DIAGNOSIS — H6500 Acute serous otitis media, unspecified ear: Secondary | ICD-10-CM

## 2013-12-04 DIAGNOSIS — F411 Generalized anxiety disorder: Secondary | ICD-10-CM

## 2013-12-04 DIAGNOSIS — H9209 Otalgia, unspecified ear: Secondary | ICD-10-CM

## 2013-12-04 DIAGNOSIS — H6502 Acute serous otitis media, left ear: Secondary | ICD-10-CM

## 2013-12-04 DIAGNOSIS — H9202 Otalgia, left ear: Secondary | ICD-10-CM

## 2013-12-04 MED ORDER — IPRATROPIUM BROMIDE 0.06 % NA SOLN: 2.0000 | Freq: Three times a day (TID) | NASAL | Status: DC

## 2013-12-04 MED ORDER — HYDROCODONE-ACETAMINOPHEN 5-325 MG PO TABS: 1.0000 | ORAL_TABLET | Freq: Four times a day (QID) | ORAL | Status: DC | PRN

## 2013-12-04 MED ORDER — PREDNISONE 20 MG PO TABS: ORAL_TABLET | ORAL | Status: DC

## 2013-12-04 MED ORDER — LORAZEPAM 0.5 MG PO TABS: 0.5000 mg | ORAL_TABLET | Freq: Every day | ORAL | Status: DC

## 2013-12-04 MED ORDER — FLUTICASONE PROPIONATE 50 MCG/ACT NA SUSP: 2.0000 | Freq: Every day | NASAL | Status: DC

## 2013-12-04 NOTE — Progress Notes (Signed)
Subjective:    Patient ID: Rebecca Grant, female    DOB: 11/26/1965, 48 y.o.   MRN: [id number]  HPI Primary Physician: JEFFERY,CHELLE, PA-C  Chief Complaint: Otalgia and medication refill  HPI: 48 y.o. female with history below presents with 2 issues.  1) Left otalgia: Left otalgia x 1 day. Some congestion, post nasal drip, rhinorrhea, sore throat, cough, and headache. Patient with multiple ear infections, especially since moving to [place] from [place] 11 years ago. Hearing is at baseline. No discharge from the ear. Ear infections are more frequent in the left ear than the right ear. Cough is mildly productive. Afebrile. Some chills. She denies fulminant allergy symptoms, but does have nasal congestion longstanding at baseline. If she stops her Flonase her nasal congestion returns. She states even while on the Flonase she has some nasal congestion.    2) Medication refill: Patient's PCP is Porfirio Oarhelle Jeffery, PA-C who is off today. Needs a refill of her lorazepam 0.5 mg 1 po qhs for anxiety. She has been on this for several years. Has not taken this in several months because she has been out. Notes that her sleep has been difficult during this time. Has a difficult time turning her mind off secondary to work.    Past Medical History  Diagnosis Date  . Anxiety   . Insomnia   . Endometriosis     s/p hysterectomy     Home Meds: Prior to Admission medications   Medication Sig Start Date End Date Taking? Authorizing Provider  LORazepam (ATIVAN) 0.5 MG tablet Take 1 tablet (0.5 mg total) by mouth at bedtime as needed for anxiety. 08/23/13  No Sherren MochaEva N Shaw, MD    Allergies: No Known Allergies  History   Social History  . Marital Status: Single    Spouse Name: n/a    Number of Children: 1  . Years of Education: 12   Occupational History  . Education officer, communityassistant store manager Unemployed   Social History Main Topics  . Smoking status: Current Every Day Smoker -- 0.30 packs/day for 6 years  . Smokeless  tobacco: Never Used     Comment: smokes for stress relief  . Alcohol Use: No  . Drug Use: No  . Sexual Activity: No   Other Topics Concern  . Not on file   Social History Narrative   Lives alone.     Review of Systems  Constitutional: Positive for chills and fatigue. Negative for fever.  HENT: Positive for congestion, ear pain, postnasal drip, rhinorrhea and sore throat. Negative for ear discharge, hearing loss and sinus pressure.        Left otalgia.   Respiratory: Positive for cough.        Mild cough that is somewhat productive.   Neurological: Positive for headaches.  Psychiatric/Behavioral: Positive for sleep disturbance. The patient is nervous/anxious.        Objective:   Physical Exam  Physical Exam: Blood pressure 130/82, pulse 90, temperature 97.8 F (36.6 C), temperature source Oral, resp. rate 16, height 5\' 5"  (1.651 m), weight 211 lb 6.4 oz (95.89 kg), SpO2 98.00%., Body mass index is 35.18 kg/(m^2). General: Well developed, well nourished, in no acute distress. Head: Normocephalic, atraumatic, eyes without discharge, sclera non-icteric, nares are without discharge. Bilateral auditory canals clear, TM's are without perforation, pearly grey and translucent with reflective cone of light bilaterally. Left TM with serous effusion. No perforation. TTP along the pinna and tragus. No mastoid TTP. Oral cavity moist, posterior pharynx  without exudate, erythema, peritonsillar abscess, or post nasal drip. Uvula midline.   Neck: Supple. No thyromegaly. Full ROM. No lymphadenopathy. Lungs: Clear bilaterally to auscultation without wheezes, rales, or rhonchi. Breathing is unlabored. Heart: RRR with S1 S2. No murmurs, rubs, or gallops appreciated. Msk:  Strength and tone normal for age. Extremities/Skin: Warm and dry. No clubbing or cyanosis. No edema. No rashes or suspicious lesions. Neuro: Alert and oriented X 3. Moves all extremities spontaneously. Gait is normal. CNII-XII  grossly in tact. Psych:  Responds to questions appropriately with a normal affect.        Assessment & Plan:  48 year old female with serous otitis media and otalgia of the left ear and anxiety  1) Serous otitis media and otalgia of the left ear -Prednisone 20 mg 2 po daily for 3 days #6 no RF, SED -Add Atrovent NS 0.06% 2 sprays each nare bid prn #1 RF 5 -Continue Flonase 2 sprays each nare daily #1 RF 5 -Norco 5/325 mg 1 po q 4-6 hours prn pain #30 no RF, SED, not to use with Ativan -Take Zyrtec daily -1 tsp of local honey daily -If symptoms persist consider ENT evaluation   2) Anxiety -Refill lorazepam 0.5 mg 1 po qhs #30 no RF -Further refills to come from her PCP, Porfirio Oar, PA-C per [facility] guidelines   Eula Listen, MHS, PA-C [facility] [address] [place], [place] [address] [phone] [facility] 12/04/2013 4:23 PM

## 2013-12-09 ENCOUNTER — Encounter: Payer: Self-pay | Admitting: Physician Assistant

## 2013-12-09 DIAGNOSIS — R05 Cough: Secondary | ICD-10-CM

## 2013-12-09 DIAGNOSIS — R059 Cough, unspecified: Secondary | ICD-10-CM

## 2013-12-10 ENCOUNTER — Other Ambulatory Visit: Payer: Self-pay | Admitting: Physician Assistant

## 2013-12-10 DIAGNOSIS — R059 Cough, unspecified: Secondary | ICD-10-CM

## 2013-12-10 DIAGNOSIS — R05 Cough: Secondary | ICD-10-CM

## 2013-12-10 MED ORDER — BENZONATATE 200 MG PO CAPS: 200.0000 mg | ORAL_CAPSULE | Freq: Three times a day (TID) | ORAL | Status: DC | PRN

## 2013-12-12 MED ORDER — GUAIFENESIN-CODEINE 100-10 MG/5ML PO SOLN: ORAL | Status: DC

## 2014-01-02 ENCOUNTER — Encounter: Payer: Self-pay | Admitting: Physician Assistant

## 2014-01-02 DIAGNOSIS — F411 Generalized anxiety disorder: Secondary | ICD-10-CM

## 2014-01-07 MED ORDER — LORAZEPAM 0.5 MG PO TABS: 0.5000 mg | ORAL_TABLET | Freq: Every day | ORAL | Status: DC

## 2014-01-07 NOTE — Telephone Encounter (Signed)
faxed

## 2014-01-07 NOTE — Telephone Encounter (Signed)
Rx printed. Patient notified via My Chart.  Meds ordered this encounter  Medications  . LORazepam (ATIVAN) 0.5 MG tablet    Sig: Take 1 tablet (0.5 mg total) by mouth at bedtime.    Dispense:  30 tablet    Refill:  0    Order Specific Question:  Supervising Provider    Answer:  Ethelda ChickSMITH, KRISTI M [[id number]]

## 2014-01-07 NOTE — Telephone Encounter (Signed)
Please fax/call to her pharmacy.

## 2014-01-07 NOTE — Addendum Note (Signed)
Addended by: Fernande BrasJEFFERY, Keniya Schlotterbeck S on: 01/07/2014 08:50 AM   Modules accepted: Orders

## 2014-02-04 ENCOUNTER — Encounter: Payer: Self-pay | Admitting: Physician Assistant

## 2014-02-04 DIAGNOSIS — F411 Generalized anxiety disorder: Secondary | ICD-10-CM

## 2014-02-05 ENCOUNTER — Encounter: Payer: Self-pay | Admitting: *Deleted

## 2014-02-05 MED ORDER — LORAZEPAM 0.5 MG PO TABS: 0.5000 mg | ORAL_TABLET | Freq: Every day | ORAL | Status: DC

## 2014-02-05 NOTE — Telephone Encounter (Signed)
Rx printed. Patient notified via My Chart.  Rebecca Grant,  I've authorized the lorazepam refill, and will get it to your pharmacy.  Remember that it's time to schedule a routine follow-up/physical appointment!  Warmly, Lileigh Fahringer  Meds ordered this encounter  Medications  . LORazepam (ATIVAN) 0.5 MG tablet    Sig: Take 1 tablet (0.5 mg total) by mouth at bedtime.    Dispense:  30 tablet    Refill:  0    Order Specific Question:  Supervising Provider    Answer:  Ethelda ChickSMITH, KRISTI M [[id number]]

## 2014-02-07 ENCOUNTER — Telehealth: Payer: Self-pay | Admitting: Family Medicine

## 2014-02-07 NOTE — Telephone Encounter (Signed)
Spoke with patient advised her to pick up RX from pharmacy

## 2014-02-21 ENCOUNTER — Encounter: Payer: Self-pay | Admitting: Physician Assistant

## 2014-02-21 MED ORDER — CLOBETASOL PROPIONATE 0.05 % EX CREA: 1.0000 "application " | TOPICAL_CREAM | Freq: Two times a day (BID) | CUTANEOUS | Status: DC | PRN

## 2014-02-21 NOTE — Telephone Encounter (Signed)
Patient notified by My Cahrt.  Meds ordered this encounter  Medications  . clobetasol cream (TEMOVATE) 0.05 %    Sig: Apply 1 application topically 2 (two) times daily as needed.    Dispense:  30 g    Refill:  0    Order Specific Question:  Supervising Provider    Answer:  DOOLITTLE, ROBERT P [[id number]]

## 2014-02-24 ENCOUNTER — Other Ambulatory Visit: Payer: Self-pay | Admitting: Physician Assistant

## 2014-02-25 NOTE — Telephone Encounter (Signed)
It looks like this is a bit early.

## 2014-02-28 NOTE — Telephone Encounter (Signed)
faxed

## 2014-03-19 ENCOUNTER — Other Ambulatory Visit: Payer: Self-pay | Admitting: Radiology

## 2014-03-19 ENCOUNTER — Encounter: Payer: Self-pay | Admitting: Physician Assistant

## 2014-03-19 ENCOUNTER — Ambulatory Visit (INDEPENDENT_AMBULATORY_CARE_PROVIDER_SITE_OTHER): Payer: BC Managed Care – PPO | Admitting: Physician Assistant

## 2014-03-19 VITALS — BP 126/82 | HR 91 | Temp 98.6°F | Resp 18 | Ht 66.0 in | Wt 204.0 lb

## 2014-03-19 DIAGNOSIS — E669 Obesity, unspecified: Secondary | ICD-10-CM | POA: Insufficient documentation

## 2014-03-19 DIAGNOSIS — G4700 Insomnia, unspecified: Secondary | ICD-10-CM

## 2014-03-19 DIAGNOSIS — R351 Nocturia: Secondary | ICD-10-CM

## 2014-03-19 DIAGNOSIS — Z0000 Encounter for general adult medical examination without abnormal findings: Secondary | ICD-10-CM

## 2014-03-19 DIAGNOSIS — J309 Allergic rhinitis, unspecified: Secondary | ICD-10-CM

## 2014-03-19 DIAGNOSIS — F411 Generalized anxiety disorder: Secondary | ICD-10-CM

## 2014-03-19 DIAGNOSIS — F419 Anxiety disorder, unspecified: Secondary | ICD-10-CM

## 2014-03-19 LAB — CBC WITH DIFFERENTIAL/PLATELET
Basophils Absolute: 0 10*3/uL (ref 0.0–0.1)
Basophils Relative: 1 % (ref 0–1)
Eosinophils Absolute: 0.2 10*3/uL (ref 0.0–0.7)
Eosinophils Relative: 5 % (ref 0–5)
HEMATOCRIT: 43.1 % (ref 36.0–46.0)
HEMOGLOBIN: 14.8 g/dL (ref 12.0–15.0)
LYMPHS ABS: 2.1 10*3/uL (ref 0.7–4.0)
LYMPHS PCT: 50 % — AB (ref 12–46)
MCH: 31.8 pg (ref 26.0–34.0)
MCHC: 34.3 g/dL (ref 30.0–36.0)
MCV: 92.7 fL (ref 78.0–100.0)
MONOS PCT: 7 % (ref 3–12)
Monocytes Absolute: 0.3 10*3/uL (ref 0.1–1.0)
NEUTROS ABS: 1.6 10*3/uL — AB (ref 1.7–7.7)
NEUTROS PCT: 37 % — AB (ref 43–77)
Platelets: 226 10*3/uL (ref 150–400)
RBC: 4.65 MIL/uL (ref 3.87–5.11)
RDW: 13.4 % (ref 11.5–15.5)
WBC: 4.2 10*3/uL (ref 4.0–10.5)

## 2014-03-19 LAB — POCT URINALYSIS DIPSTICK
Bilirubin, UA: NEGATIVE
Blood, UA: NEGATIVE
Glucose, UA: NEGATIVE
KETONES UA: NEGATIVE
LEUKOCYTES UA: NEGATIVE
Nitrite, UA: NEGATIVE
PH UA: 5.5
PROTEIN UA: NEGATIVE
Spec Grav, UA: 1.015
UROBILINOGEN UA: 0.2

## 2014-03-19 LAB — POCT UA - MICROSCOPIC ONLY
CRYSTALS, UR, HPF, POC: NEGATIVE
Casts, Ur, LPF, POC: NEGATIVE
Mucus, UA: POSITIVE
YEAST UA: NEGATIVE

## 2014-03-19 MED ORDER — LORAZEPAM 1 MG PO TABS: 1.0000 mg | ORAL_TABLET | Freq: Two times a day (BID) | ORAL | Status: DC | PRN

## 2014-03-19 MED ORDER — FLUTICASONE PROPIONATE 50 MCG/ACT NA SUSP: 2.0000 | Freq: Every day | NASAL | Status: DC

## 2014-03-19 NOTE — Patient Instructions (Addendum)
I will contact you with your lab results as soon as they are available.   If you have not heard from me in 2 weeks, please contact me.  The fastest way to get your results is to register for My Chart (see the instructions on the last page of this printout).  Drink 64 ounces of water daily, but before 6 pm (I recommend a 32 ounce water bottle.  Fill it in the morning and finish it by lunch.  Re-fill it at lunch, and finish it by supper).  Two Dentists I really Like: Karlene EinsteinLisa Jo Adornetto, DDS Edison PaceSantiya Bell, DDS  Check out [email or web address]  I will contact you with names of therapists with special expertise in decreased libido/sexuality issues.  Keeping You Healthy  Get These Tests 1. Blood Pressure- Have your blood pressure checked once a year by your health care provider.  Normal blood pressure is 120/80. 2. Weight- Have your body mass index (BMI) calculated to screen for obesity.  BMI is measure of body fat based on height and weight.  You can also calculate your own BMI at https://www.west-esparza.com/www.nhlbisupport.com/bmi/. 3. Cholesterol- Have your cholesterol checked every 5 years starting at age 46 then yearly starting at age 46. 4. Chlamydia, HIV, and other sexually transmitted diseases- Get screened every year until age 46, then within three months of each new sexual provider. 5. Pap Smear- Every 1-3 years; discuss with your health care provider. 6. Mammogram- Every year starting at age 46  Take these medicines  Calcium with Vitamin D-Your body needs 1200 mg of Calcium each day and [phone] IU of Vitamin D daily.  Your body can only absorb 500 mg of Calcium at a time so Calcium must be taken in 2 or 3 divided doses throughout the day.  Multivitamin with folic acid- Once daily if it is possible for you to become pregnant.  Get these Immunizations  Gardasil-Series of three doses; prevents HPV related illness such as genital warts and cervical cancer.  Menactra-Single dose; prevents meningitis.  Tetanus shot-  Every 10 years.  Flu shot-Every year.  Take these steps 1. Do not smoke-Your healthcare provider can help you quit.  For tips on how to quit go to www.smokefree.gov or call 1-800 QUITNOW. 2. Be physically active- Exercise 5 days a week for at least 30 minutes.  If you are not already physically active, start slow and gradually work up to 30 minutes of moderate physical activity.  Examples of moderate activity include walking briskly, dancing, swimming, bicycling, etc. 3. Breast Cancer- A self breast exam every month is important for early detection of breast cancer.  For more information and instruction on self breast exams, ask your healthcare provider or [place].eswww.womenshealth.gov/faq/breast-self-exam.cfm. 4. Eat a healthy diet- Eat a variety of healthy foods such as fruits, vegetables, whole grains, low fat milk, low fat cheeses, yogurt, lean meats, poultry and fish, beans, nuts, tofu, etc.  For more information go to www. Thenutritionsource.org 5. Drink alcohol in moderation- Limit alcohol intake to one drink or less per day. Never drink and drive. 6. Depression- Your emotional health is as important as your physical health.  If you're feeling down or losing interest in things you normally enjoy please talk to your healthcare provider about being screened for depression. 7. Dental visit- Brush and floss your teeth twice daily; visit your dentist twice a year. 8. Eye doctor- Get an eye exam at least every 2 years. 9. Helmet use- Always wear a helmet when riding a bicycle, motorcycle,  rollerblading or skateboarding. 10. Safe sex- If you may be exposed to sexually transmitted infections, use a condom. 11. Seat belts- Seat belts can save your live; always wear one. 12. Smoke/Carbon Monoxide detectors- These detectors need to be installed on the appropriate level of your home. Replace batteries at least once a year. 13. Skin cancer- When out in the sun please cover up and use sunscreen 15 SPF or  higher. 14. Violence- If anyone is threatening or hurting you, please tell your healthcare provider.

## 2014-03-19 NOTE — Progress Notes (Signed)
Subjective:    Patient ID: Rebecca Grant, female    DOB: 08/06/1966, 48 y.o.   MRN: [id number]   PCP: Jonnae Fonseca, PA-C  Chief Complaint  Patient presents with  . Annual Exam    no pap     Active Ambulatory Problems    Diagnosis Date Noted  . Insomnia   . Anxiety 03/08/2013  . Nocturia 03/19/2014  . Obesity (BMI 30-39.9) 03/19/2014   Resolved Ambulatory Problems    Diagnosis Date Noted  . No Resolved Ambulatory Problems   Past Medical History  Diagnosis Date  . Endometriosis     Past Surgical History  Procedure Laterality Date  . Abdominal hysterectomy    . Appendectomy      No Known Allergies  Prior to Admission medications   Medication Sig Start Date End Date Taking? Authorizing Provider  clobetasol cream (TEMOVATE) 0.05 % Apply 1 application topically 2 (two) times daily as needed. 02/21/14  Yes Breslin Burklow S Breezie Micucci, PA-C  fluticasone (FLONASE) 50 MCG/ACT nasal spray Place 2 sprays into both nostrils daily. 03/19/14  Yes Kruze Atchley S Raynald Rouillard, PA-C  ipratropium (ATROVENT) 0.06 % nasal spray Place 2 sprays into the nose 3 (three) times daily. 12/04/13   Raymon Muttonyan M Dunn, PA-C  LORazepam (ATIVAN) 0.5 MG tablet Take 1 tablet (0.5 mg total) by mouth 2 (two) times daily as needed for anxiety. 03/19/14   Fernande Brashelle S Emilio Baylock, PA-C    History   Social History  . Marital Status: Single    Spouse Name: n/a    Number of Children: 1  . Years of Education: 12   Occupational History  . Education officer, communityassistant store manager Unemployed   Social History Main Topics  . Smoking status: Current Every Day Smoker -- 0.30 packs/day for 6 years  . Smokeless tobacco: Never Used     Comment: smokes for stress relief, down to 2 cigarettes daily  . Alcohol Use: No  . Drug Use: No  . Sexual Activity: No   Other Topics Concern  . None   Social History Narrative   Lives alone.    family history includes Gout in her maternal grandfather and maternal grandmother; Heart disease in her maternal grandfather;  Hypertension in her maternal grandfather and mother; Seizures (age of onset: 6633) in her sister. indicated that her mother is alive. She indicated that her father is alive. She indicated that her sister is alive. She indicated that both of her brothers are alive. She indicated that her maternal grandmother is alive. She indicated that her maternal grandfather is alive. She indicated that her daughter is alive.   HPI  Presents for Annual Exam.  Breast/pelvic performed by GYN. Vaccinations are current. Mammogram is overdue-orders are in, she needs to call and schedule, and is encouraged to do so.  Review of Systems  Constitutional: Negative.   HENT: Positive for congestion, ear pain (not severe, associated with increased nasal congestion) and rhinorrhea.   Eyes: Negative.   Respiratory: Negative.   Cardiovascular: Negative.   Gastrointestinal: Negative.   Endocrine: Negative.   Genitourinary: Positive for urgency and frequency (about every 90 minutes during the night.).       Has cut out sugary drinks. No liquids after 9 pm. No libido.  Musculoskeletal: Negative.   Skin: Negative.   Allergic/Immunologic: Negative.   Neurological: Negative.   Hematological: Negative.   Psychiatric/Behavioral: Positive for sleep disturbance. The patient is nervous/anxious.        Objective:   Physical Exam  Vitals reviewed.  Constitutional: She is oriented to person, place, and time. Vital signs are normal. She appears well-developed and well-nourished. She is active and cooperative. No distress.  HENT:  Head: Normocephalic and atraumatic.  Right Ear: Hearing, tympanic membrane, external ear and ear canal normal. No foreign bodies.  Left Ear: Hearing, tympanic membrane, external ear and ear canal normal. No foreign bodies.  Nose: Nose normal.  Mouth/Throat: Uvula is midline, oropharynx is clear and moist and mucous membranes are normal. No oral lesions. Normal dentition. No dental abscesses or uvula  swelling. No oropharyngeal exudate.  Eyes: Conjunctivae, EOM and lids are normal. Pupils are equal, round, and reactive to light. Right eye exhibits no discharge. Left eye exhibits no discharge. No scleral icterus.  Fundoscopic exam:      The right eye shows no arteriolar narrowing, no AV nicking, no exudate, no hemorrhage and no papilledema. The right eye shows red reflex.       The left eye shows no arteriolar narrowing, no AV nicking, no exudate, no hemorrhage and no papilledema. The left eye shows red reflex.  Neck: Trachea normal, normal range of motion and full passive range of motion without pain. Neck supple. No spinous process tenderness and no muscular tenderness present. No mass and no thyromegaly present.  Cardiovascular: Normal rate, regular rhythm, normal heart sounds, intact distal pulses and normal pulses.   Pulmonary/Chest: Effort normal and breath sounds normal.  Musculoskeletal: She exhibits no edema and no tenderness.       Cervical back: Normal.       Thoracic back: Normal.       Lumbar back: Normal.  Lymphadenopathy:       Head (right side): No tonsillar, no preauricular, no posterior auricular and no occipital adenopathy present.       Head (left side): No tonsillar, no preauricular, no posterior auricular and no occipital adenopathy present.    She has no cervical adenopathy.       Right: No supraclavicular adenopathy present.       Left: No supraclavicular adenopathy present.  Neurological: She is alert and oriented to person, place, and time. She has normal strength and normal reflexes. No cranial nerve deficit. She exhibits normal muscle tone. Coordination and gait normal.  Skin: Skin is warm, dry and intact. No rash noted. She is not diaphoretic. No cyanosis or erythema. Nails show no clubbing.  Psychiatric: She has a normal mood and affect. Her speech is normal and behavior is normal. Judgment and thought content normal.   Results for orders placed in visit on  03/19/14    POCT URINALYSIS DIPSTICK      Result Value Ref Range   Color, UA payne     Clarity, UA yellow     Glucose, UA neg     Bilirubin, UA neg     Ketones, UA neg     Spec Grav, UA 1.015     Blood, UA neg     pH, UA 5.5     Protein, UA neg     Urobilinogen, UA 0.2     Nitrite, UA neg     Leukocytes, UA Negative    POCT UA - MICROSCOPIC ONLY      Result Value Ref Range   WBC, Ur, HPF, POC 0-1     RBC, urine, microscopic 0-1     Bacteria, U Microscopic trace     Mucus, UA pos     Epithelial cells, urine per micros 2-6     Crystals,  Ur, HPF, POC neg     Casts, Ur, LPF, POC neg     Yeast, UA neg            Assessment & Plan:  1. Annual physical exam Age appropriate anticipatory guidance provided. - POCT urinalysis dipstick  2. Insomnia It appears that her insomnia may be related to nocturia. In the meantime, increase lorazepam to 1 mg QHS. - CBC with Differential  3. Anxiety This centers primarily around her work stress and her lack of interest in sex.  Worsened by poor sleep. I will look for therapists in our area specializing in sexual dysfunction and contact her. Addressing the nocturia should improve her sleep and therefore improve her ability to manage her stressors. - Comprehensive metabolic panel - Lipid panel - TSH  4. Nocturia Await UCx. Presume it will be negative.  Drink 64 ounces of water daily, but none after 6 pm (32 before lunch, 32 before supper). Add ditropan at HS if UCx is negative. - POCT UA - Microscopic Only - Urine culture  5. Allergic rhinitis Continue Flonase.  Add Atrovent NS intermittently when symptoms worsen. - fluticasone (FLONASE) 50 MCG/ACT nasal spray; Place 2 sprays into both nostrils daily.  Dispense: 16 g; Refill: 12   Fernande Bras, PA-C Physician Assistant-Certified [facility]

## 2014-03-20 LAB — COMPREHENSIVE METABOLIC PANEL
ALK PHOS: 69 U/L (ref 39–117)
ALT: 19 U/L (ref 0–35)
AST: 18 U/L (ref 0–37)
Albumin: 4.3 g/dL (ref 3.5–5.2)
BILIRUBIN TOTAL: 0.4 mg/dL (ref 0.2–1.2)
BUN: 6 mg/dL (ref 6–23)
CO2: 24 meq/L (ref 19–32)
CREATININE: 0.76 mg/dL (ref 0.50–1.10)
Calcium: 9.5 mg/dL (ref 8.4–10.5)
Chloride: 103 mEq/L (ref 96–112)
GLUCOSE: 85 mg/dL (ref 70–99)
Potassium: 4.6 mEq/L (ref 3.5–5.3)
Sodium: 139 mEq/L (ref 135–145)
Total Protein: 6.8 g/dL (ref 6.0–8.3)

## 2014-03-20 LAB — LIPID PANEL
CHOLESTEROL: 143 mg/dL (ref 0–200)
HDL: 26 mg/dL — ABNORMAL LOW (ref >39)
LDL Cholesterol: 103 mg/dL — ABNORMAL HIGH (ref 0–99)
TRIGLYCERIDES: 72 mg/dL (ref <150)
Total CHOL/HDL Ratio: 5.5 Ratio
VLDL: 14 mg/dL (ref 0–40)

## 2014-03-20 LAB — TSH: TSH: 0.549 u[IU]/mL (ref 0.350–4.500)

## 2014-03-21 LAB — URINE CULTURE
Colony Count: NO GROWTH
ORGANISM ID, BACTERIA: NO GROWTH

## 2014-03-21 MED ORDER — OXYBUTYNIN CHLORIDE 5 MG PO TABS: 5.0000 mg | ORAL_TABLET | Freq: Every day | ORAL | Status: DC

## 2014-03-21 NOTE — Addendum Note (Signed)
Addended by: Fernande Bras on: 03/21/2014 09:27 PM   Modules accepted: Orders

## 2014-04-17 ENCOUNTER — Encounter: Payer: Self-pay | Admitting: Physician Assistant

## 2014-04-17 DIAGNOSIS — G4700 Insomnia, unspecified: Secondary | ICD-10-CM

## 2014-04-18 MED ORDER — LORAZEPAM 1 MG PO TABS: 1.0000 mg | ORAL_TABLET | Freq: Two times a day (BID) | ORAL | Status: DC | PRN

## 2014-04-18 NOTE — Telephone Encounter (Signed)
Patient notified via My Chart. Rx printed at 104.  Will bring to 102 after clinic.

## 2014-04-19 NOTE — Telephone Encounter (Signed)
Faxed

## 2014-04-21 ENCOUNTER — Ambulatory Visit: Payer: BC Managed Care – PPO

## 2014-04-21 ENCOUNTER — Ambulatory Visit (INDEPENDENT_AMBULATORY_CARE_PROVIDER_SITE_OTHER): Payer: BC Managed Care – PPO | Admitting: Family Medicine

## 2014-04-21 VITALS — BP 122/84 | HR 71 | Temp 98.9°F | Resp 16 | Ht 66.25 in | Wt 201.6 lb

## 2014-04-21 DIAGNOSIS — H6505 Acute serous otitis media, recurrent, left ear: Secondary | ICD-10-CM

## 2014-04-21 DIAGNOSIS — H6500 Acute serous otitis media, unspecified ear: Secondary | ICD-10-CM

## 2014-04-21 LAB — CBC WITH DIFFERENTIAL/PLATELET
Basophils Absolute: 0 10*3/uL (ref 0.0–0.1)
Basophils Relative: 0 % (ref 0–1)
Eosinophils Absolute: 0.2 10*3/uL (ref 0.0–0.7)
Eosinophils Relative: 3 % (ref 0–5)
HEMATOCRIT: 43 % (ref 36.0–46.0)
HEMOGLOBIN: 14.7 g/dL (ref 12.0–15.0)
Lymphocytes Relative: 46 % (ref 12–46)
Lymphs Abs: 2.6 10*3/uL (ref 0.7–4.0)
MCH: 32.1 pg (ref 26.0–34.0)
MCHC: 34.2 g/dL (ref 30.0–36.0)
MCV: 93.9 fL (ref 78.0–100.0)
MONO ABS: 0.4 10*3/uL (ref 0.1–1.0)
Monocytes Relative: 8 % (ref 3–12)
Neutro Abs: 2.4 10*3/uL (ref 1.7–7.7)
Neutrophils Relative %: 43 % (ref 43–77)
PLATELETS: 220 10*3/uL (ref 150–400)
RBC: 4.58 MIL/uL (ref 3.87–5.11)
RDW: 13.5 % (ref 11.5–15.5)
WBC: 5.6 10*3/uL (ref 4.0–10.5)

## 2014-04-21 MED ORDER — HYDROCODONE-HOMATROPINE 5-1.5 MG/5ML PO SYRP: 5.0000 mL | ORAL_SOLUTION | Freq: Three times a day (TID) | ORAL | Status: DC | PRN

## 2014-04-21 MED ORDER — PREDNISONE 20 MG PO TABS: 60.0000 mg | ORAL_TABLET | Freq: Every day | ORAL | Status: DC

## 2014-04-21 NOTE — Progress Notes (Signed)
Subjective:    Patient ID: Rebecca Grant, female    DOB: Feb 02, 1966, 48 y.o.   MRN: [id number] Chief Complaint  Patient presents with  . Otalgia    left ear pain.  pain x 3-4 days.  pt stated that she needs tubes in her ears.  her ears do not drain properly.      HPI  Has been having increased nasal congestion for > 1 mo so started on flonase and atrovent but sxs cont to worsen.   Severe HA started yesterday - had to sleep sitting up as when she layed down she got more pressure on left ear and it was unbearable. Not a lot of sinus pain but has had pressure.  Left ear feels muffled but is not draining.  Has had mild pharyngitis x 3d but no cough. Not sleeping well due to pain and sxs. Has been using advil and aleve but no other otc cold meds or decongestants. No sinus rinse/netti pot. Has had more chills but no fever. Was on vacation in [place] recently. Was given prednisone alone with last episode which helped along with pain medication. Auralgan gtts didn't help at all.  Past Medical History  Diagnosis Date  . Anxiety   . Insomnia   . Endometriosis     s/p hysterectomy   Current Outpatient Prescriptions on File Prior to Visit  Medication Sig Dispense Refill  . clobetasol cream (TEMOVATE) 4.15 % Apply 1 application topically 2 (two) times daily as needed.  30 g  0  . fluticasone (FLONASE) 50 MCG/ACT nasal spray Place 2 sprays into both nostrils daily.  16 g  12  . ipratropium (ATROVENT) 0.06 % nasal spray Place 2 sprays into the nose 3 (three) times daily.  15 mL  5  . LORazepam (ATIVAN) 1 MG tablet Take 1 tablet (1 mg total) by mouth 2 (two) times daily as needed for anxiety.  30 tablet  0  . oxybutynin (DITROPAN) 5 MG tablet Take 1 tablet (5 mg total) by mouth at bedtime.  30 tablet  3   No current facility-administered medications on file prior to visit.   No Known Allergies  Review of Systems  Constitutional: Positive for chills, activity change and fatigue. Negative for fever,  diaphoresis and appetite change.  HENT: Positive for congestion, ear pain, hearing loss, postnasal drip, rhinorrhea, sinus pressure and sore throat. Negative for ear discharge, nosebleeds, sneezing, trouble swallowing and voice change.   Eyes: Negative for discharge and itching.  Respiratory: Negative for cough and shortness of breath.   Cardiovascular: Negative for chest pain.  Gastrointestinal: Negative for nausea, vomiting and abdominal pain.  Musculoskeletal: Negative for neck pain and neck stiffness.  Skin: Negative for rash.  Allergic/Immunologic: Positive for environmental allergies. Negative for immunocompromised state.  Neurological: Positive for headaches. Negative for dizziness and syncope.  Hematological: Positive for adenopathy.  Psychiatric/Behavioral: Positive for sleep disturbance.      BP 122/84  Pulse 71  Temp(Src) 98.9 F (37.2 C) (Oral)  Resp 16  Ht 5' 6.25" (1.683 m)  Wt 201 lb 9.6 oz (91.445 kg)  BMI 32.28 kg/m2  SpO2 99% Objective:   Physical Exam  Constitutional: She is oriented to person, place, and time. She appears well-developed and well-nourished. No distress.  HENT:  Head: Normocephalic and atraumatic.  Right Ear: Tympanic membrane, external ear and ear canal normal.  Left Ear: External ear and ear canal normal. Tympanic membrane is retracted. A middle ear effusion is present.  Eyes: Conjunctivae are normal. No scleral icterus.  Neck: Normal range of motion. Neck supple. No thyromegaly present.  Cardiovascular: Normal rate, regular rhythm, normal heart sounds and intact distal pulses.   Pulmonary/Chest: Effort normal and breath sounds normal. No respiratory distress.  Musculoskeletal: She exhibits no edema.  Lymphadenopathy:    She has no cervical adenopathy.  Neurological: She is alert and oriented to person, place, and time.  Skin: Skin is warm and dry. She is not diaphoretic. No erythema.  Psychiatric: Her behavior is normal. She exhibits a  depressed mood.  Tearful, clutching head      Assessment & Plan:   Recurrent acute serous otitis media of left ear - Plan: Ambulatory referral to ENT, CBC with Differential, CANCELED: CBC This has been pt's 3rd episode in 8 mos. Last OV pt was successfully treated w/ prednisone and pain medicine along which will try again as no sign of active bacterial infection today on exam. If f/c/increased pain, drainage, cont sxs, etc - may need to call in augmentin. Pt sxs so severe, she always requires narcotic pain medication - sudafed, flonase, auralgan have helped minimally. Refer to ENT for eval.  Meds ordered this encounter  Medications  . predniSONE (DELTASONE) 20 MG tablet    Sig: Take 3 tablets (60 mg total) by mouth daily with breakfast.    Dispense:  9 tablet    Refill:  0  . HYDROcodone-homatropine (HYCODAN) 5-1.5 MG/5ML syrup    Sig: Take 5 mLs by mouth every 8 (eight) hours as needed for cough.    Dispense:  90 mL    Refill:  0    Delman Cheadle, MD MPH  Results for orders placed in visit on 04/21/14  CBC WITH DIFFERENTIAL      Result Value Ref Range   WBC 5.6  4.0 - 10.5 K/uL   RBC 4.58  3.87 - 5.11 MIL/uL   Hemoglobin 14.7  12.0 - 15.0 g/dL   HCT 43.0  36.0 - 46.0 %   MCV 93.9  78.0 - 100.0 fL   MCH 32.1  26.0 - 34.0 pg   MCHC 34.2  30.0 - 36.0 g/dL   RDW 13.5  11.5 - 15.5 %   Platelets 220  150 - 400 K/uL   Neutrophils Relative % 43  43 - 77 %   Neutro Abs 2.4  1.7 - 7.7 K/uL   Lymphocytes Relative 46  12 - 46 %   Lymphs Abs 2.6  0.7 - 4.0 K/uL   Monocytes Relative 8  3 - 12 %   Monocytes Absolute 0.4  0.1 - 1.0 K/uL   Eosinophils Relative 3  0 - 5 %   Eosinophils Absolute 0.2  0.0 - 0.7 K/uL   Basophils Relative 0  0 - 1 %   Basophils Absolute 0.0  0.0 - 0.1 K/uL   Smear Review Criteria for review not met

## 2014-04-21 NOTE — Patient Instructions (Addendum)
Hot showers or breathing in steam may help loosen the congestion.  Using a netti pot or sinus rinse is also likely to help you feel better and keep this from progressing.  Use the atrovent nasal spray as needed throughout the day and use the fluticasone nasal spray every night before bed for at least 2 weeks.  I recommend augmenting with 12 hr sudafed (behind the counter) to help you move out the congestion.  If no improvement or you are getting worse, come back as you might need a course of antibiotics but hopefully with all of the above, you can avoid it.   Serous Otitis Media  Serous otitis media is fluid in the middle ear space. This space contains the bones for hearing and air. Air in the middle ear space helps to transmit sound.  The air gets there through the eustachian tube. This tube goes from the back of the nose (nasopharynx) to the middle ear space. It keeps the pressure in the middle ear the same as the outside world. It also helps to drain fluid from the middle ear space. CAUSES  Serous otitis media occurs when the eustachian tube gets blocked. Blockage can come from:  Ear infections.  Colds and other upper respiratory infections.  Allergies.  Irritants such as cigarette smoke.  Sudden changes in air pressure (such as descending in an airplane).  Enlarged adenoids.  A mass in the nasopharynx. During colds and upper respiratory infections, the middle ear space can become temporarily filled with fluid. This can happen after an ear infection also. Once the infection clears, the fluid will generally drain out of the ear through the eustachian tube. If it does not, then serous otitis media occurs. SIGNS AND SYMPTOMS   Hearing loss.  A feeling of fullness in the ear, without pain.  Young children may not show any symptoms but may show slight behavioral changes, such as agitation, ear pulling, or crying. DIAGNOSIS  Serous otitis media is diagnosed by an ear exam. Tests may be  done to check on the movement of the eardrum. Hearing exams may also be done. TREATMENT  The fluid most often goes away without treatment. If allergy is the cause, allergy treatment may be helpful. Fluid that persists for several months may require minor surgery. A small tube is placed in the eardrum to:  Drain the fluid.  Restore the air in the middle ear space. In certain situations, antibiotic medicines are used to avoid surgery. Surgery may be done to remove enlarged adenoids (if this is the cause). HOME CARE INSTRUCTIONS   Keep children away from tobacco smoke.  Be sure to keep any follow-up appointments. SEEK MEDICAL CARE IF:   Your hearing is not better in 3 months.  Your hearing is worse.  You have ear pain.  You have drainage from the ear.  You have dizziness.  You have serous otitis media only in one ear or have any bleeding from your nose (epistaxis).  You notice a lump on your neck. MAKE SURE YOU:  Understand these instructions.   Will watch your condition.   Will get help right away if you are not doing well or get worse.  Document Released: 12/25/2003 Document Revised: 10/09/2013 Document Reviewed: 05/01/2013 [facility] Patient Information 2015 [employer] [place]. This information is not intended to replace advice given to you by your health care provider. Make sure you discuss any questions you have with your health care provider.

## 2014-04-26 ENCOUNTER — Encounter: Payer: Self-pay | Admitting: Physician Assistant

## 2014-04-29 ENCOUNTER — Ambulatory Visit (INDEPENDENT_AMBULATORY_CARE_PROVIDER_SITE_OTHER): Payer: BC Managed Care – PPO | Admitting: Family Medicine

## 2014-04-29 VITALS — BP 106/72 | HR 89 | Temp 98.2°F | Resp 16 | Ht 66.5 in | Wt 200.0 lb

## 2014-04-29 DIAGNOSIS — J029 Acute pharyngitis, unspecified: Secondary | ICD-10-CM

## 2014-04-29 DIAGNOSIS — K029 Dental caries, unspecified: Secondary | ICD-10-CM

## 2014-04-29 DIAGNOSIS — J0120 Acute ethmoidal sinusitis, unspecified: Secondary | ICD-10-CM

## 2014-04-29 DIAGNOSIS — H65192 Other acute nonsuppurative otitis media, left ear: Secondary | ICD-10-CM

## 2014-04-29 DIAGNOSIS — H65199 Other acute nonsuppurative otitis media, unspecified ear: Secondary | ICD-10-CM

## 2014-04-29 MED ORDER — HYDROCODONE-IBUPROFEN 5-200 MG PO TABS: 1.0000 | ORAL_TABLET | Freq: Three times a day (TID) | ORAL | Status: DC | PRN

## 2014-04-29 MED ORDER — AMOXICILLIN 875 MG PO TABS: 875.0000 mg | ORAL_TABLET | Freq: Two times a day (BID) | ORAL | Status: DC

## 2014-04-29 MED ORDER — HYDROCODONE-ACETAMINOPHEN 5-325 MG PO TABS: 1.0000 | ORAL_TABLET | Freq: Three times a day (TID) | ORAL | Status: DC | PRN

## 2014-04-29 NOTE — Patient Instructions (Signed)
RESOURCE GUIDE ° °Chronic Pain Problems: °Contact [facility]  [phone] °Patients need to be referred by their primary care doctor. ° °Insufficient Money for Medicine: °Contact United Way:  call (888) 892-1162 ° °No Primary Care Doctor: °- Call [facility]  [phone] - can help you locate a primary care doctor that  accepts your insurance, provides certain services, etc. °- Physician Referral Service- 1-800-533-3463 ° °Agencies that provide inexpensive medical care: °- [facility]  [phone] °- [facility]  [phone] °- [facility]  [phone] °- [facility]  [phone] °- [facility]  [phone] °- [facility]  [phone] ° °Medicaid-accepting [place] Providers: °- [facility]- [address] ° [phone], Mon-Fri 9am-7pm, Sat 9am-1pm °- [facility]- [address], [address] ° [phone] °- [facility]- [address], [address] ° [phone] °- [facility]- [address] [place] [address] ° [phone] °- [facility]- [address], [phone] ° Only accepts [place] Access Medicaid patients after they have their name  applied to their card ° °Self Pay (no insurance) in [place]: °- Sickle Cell Patients - [facility] ° [address], [phone] °- [facility]- [address] ° [phone] °      -     [facility]- [address] °      -     [facility]- see information above (Speak to Pam H if you do not have insurance) °      -  [facility] [place]- [address],  [phone] °      -  [facility]- [address] [place] [address], [phone] °      -  Dr Osei-Bonsu-  [address], [place], [phone] °      -  [facility] - [address], [phone] °      -  [facility]- [address] [place] [address], [phone], also  [address], [phone] °      -     [facility]- [address], [phone], 1st & 3rd Saturday °        every month, 10am-1pm ° -     [facility] °  [address], [place]. °  Phone:  [phone], Fax:  [phone]. Hours of Operation:  9 am - 6 pm, M-F. ° -     [facility] °  [address], [place] °  Phone: [phone], Fax: [phone]. Hours of Operation:  8:30 am - 5:30 pm, M-F. ° °[facility] °[address] °[place], [place] [address] °(336) [phone] ° °[facility] °[address] °[place], [place] [address] °(336) [phone] ° °1) Find a Doctor and Pay Out of Pocket °Although you won't have to find out who is covered by your insurance plan, it is a good idea to ask around and get recommendations. You will then need to call the office and see if the doctor you have chosen will accept you as a new patient and what types of options they offer for patients who are self-pay. Some doctors offer discounts or will set up payment plans for their patients who do not have insurance, but   you will need to ask so you aren't surprised when you get to your appointment. ° °2) Contact Your Local Health Department °Not all health departments have doctors that can see patients for sick visits, but many do, so it is worth a call to see if yours does. If you don't know where your local health department is, you can check in your phone book. The CDC also has a tool to help you locate your state's health department, and many state websites also have listings of all of their local health departments. ° °3) Find a Walk-in Clinic °If your illness is not likely to be very severe or complicated, you may want to try a walk in clinic. These are popping up all over the country in pharmacies, drugstores, and shopping centers. They're usually staffed by nurse practitioners or physician assistants that have been trained  to treat common illnesses and complaints. They're usually fairly quick and inexpensive. However, if you have serious medical issues or chronic medical problems, these are probably not your best option ° °STD Testing °- [place] [facility] [place], [facility], [address], [place], phone [phone] or [phone].  Monday - Friday, call for an appointment. °- [place] [place], [facility], [address], [place], phone [phone] or [phone].  Monday - Friday, call for an appointment. ° °Abuse/Neglect: °- [place] Child Abuse Hotline (336) 641-3795 °- [place] Child Abuse Hotline 800-378-5315 (After Hours) ° °Emergency Shelter:  [facility] (336) 271-5985 ° °Maternity Homes: °- [facility] (336) 275-9566 °- [facility] (704) 372-4663 ° °MRSA Hotline #:   832-7006 ° °Dental Assistance °If unable to pay or uninsured, contact:  [place] [facility] to become qualified for the adult dental clinic. ° °Patients with Medicaid: [facility] °[address], [phone] °[address], [phone] ° °If unable to pay, or uninsured, contact [place] [facility] ([phone] in [place], [phone] in [place]) to become qualified for the adult dental clinic ° °[facility] °[address] °[place], [place] [address] °(336) 272-4177 °www.[facility].com ° °Other Low-Cost Community Dental Services: °- [facility]- [address], [place], [place], [address], [phone], Ext. 123, 2nd and 4th Thursday of the month at 6:30am.  10 clients each day by appointment, can sometimes see walk-in patients if someone does not show for an appointment. °- [facility]- [address], [place], [place], [address], [phone] °- [facility]- [address], [place], [place], [address], [phone] °- [place]  [facility]- 342-8273 °- [facility]- 703-3100 °- [place] [facility]- 570-6415 ° °     Behavioral Health Resources in the Community ° °Intensive Outpatient Programs: °[facility]      °[address] °[place], [place] °336-878-6098 °Both a day and evening program °      °[facility]     °[address]        °[place], [place] [address] °336-832-9800        ° °[facility] °[address] °[place] °336-882-2125 ° °[facility] °ACCESS LINE: 1-800-853-5163 or 336-641-4981 °[address] °, [place] [address] °Http://www.[email or web address].[email or web address]/adult.htm ° ° °Substance Abuse Resources: °- [facility]  336-882-2125 °- [facility] 336-784-9470 °- [facility] 336-285-9073 °- [facility] 336-845-3988 °-   Residential & Outpatient Substance Abuse Program  800-659-3381 ° °Psychological Services: °- [facility]  832-9600 °- [facility]  378-7881 °- [facility], [address], [place], ACCESS LINE: 1-800-853-5163 or 336-641-4981, Http://www.[facility].com/services/adult.htm ° °Mobile Crisis Teams:         °                               °[facility]         °Mobile Crisis Care Unit °1-877-626-1772       °      °Assertive °[facility] °[address] [place] °336-834-9664 °                                        °Interventionist °Sharon DeEsch °[address] °[place] °336-554-5454 ° °Self-Help/Support Groups: °[facility] Variety of support groups °373-1402 (call for more info) ° °Narcotics Anonymous (NA) °[facility] °[address] °[place] - 2 meetings at this location ° °Residential Treatment Programs:  °[facility]      °[address]        °[place]       °866-801-8205        ° °[facility] °[address] °[place], [place]  [address] °704-293-8524 ° °[facility]  °[address] °[place], [place] [address] °336-845-3988 °Admissions: 8am-3pm M-F ° °[facility]     °[address]        °[place], [place] [address]       °336-841-1104        ° °[facility] °[address] °[place], [place] °336-379-7146 ° °[facility] °[address] °[place], [place] °336-285-9073 ° °[facility] - Intensive Outpatient      °[address]     °[place], [place]       °852-3033        ° °[facility]     °[address] °[place], [place] °877-615-2722 or 336-784-9470 ° °[facility] ([facility]), Medicaid °[address] °[place], [place] °336-227-7417 ° °[facility]                                               °[address] °[place] °800-659-3381 ° °[place] [facility] Resources: °[facility]- 1-888-581-9988              ° °General Therapy                                                °Julie Brannon, PhD        °[address]                                       °[place], [place] [address]         °336-349-5553   °Insurance ° °[facility]   °[address] °[place], [place] [address] °336-349-4454 ° °[facility] °[address] [place], [place] [address] °336-342-8316 °Insurance/Medicaid/sponsorship through [facility] ° °[facility]                                              °[address]                                        °[place], [place] [address]    °Therapy/tele-psych/case         °336-342-8316        °  °[facility] °[address]  ° [place], [place]  [address]  °Adolescent/group home/case management °336-349-2233  °                                         °Julia Brannon PhD       °General therapy       °Insurance   °336-951-0000        ° °Dr. Arfeen, Insurance, M-F °336- 349-4544 ° °[facility] [place]  [place]  [employer] [place] [facility] °[address]                 [address]         [address]  °[place]                                               [place] °Phone:  [phone]                                  Phone:  [phone]                   Phone:  342-8140 ° °[facility], 342-8316 °- [place] [facility] - [facility]- 1-888-581-9988 °      -     [facility] in , [address], °            336-349-4454, Insurance ° °[place] Child Abuse Hotline °(336) 342-1394 or (336) 342-3537 (After Hours) ° ° °

## 2014-04-29 NOTE — Progress Notes (Signed)
 Chief Complaint:  Chief Complaint  Patient presents with  . Follow-up    ear pain   . Sore Throat  . Insomnia    HPI: Rebecca Grant is a 48 y.o. female who is here for  Worsening Left ear pain radiating from let jaw to sinuese. + chills, + sore throat, no fevers. She was seen here on 04/21/14 and was rx prednisone, No other sxs.   OV 04/21/14 HPI Has been having increased nasal congestion for > 1 mo so started on flonase and atrovent but sxs cont to worsen.  Severe HA started yesterday - had to sleep sitting up as when she layed down she got more pressure on left ear and it was unbearable. Not a lot of sinus pain but has had pressure. Left ear feels muffled but is not draining.  Has had mild pharyngitis x 3d but no cough.  Not sleeping well due to pain and sxs.  Has been using advil and aleve but no other otc cold meds or decongestants. No sinus rinse/netti pot.  Has had more chills but no fever.  Was on vacation in [place] recently.  Was given prednisone alone with last episode which helped along with pain medication. Auralgan gtts didn't help at all.   Past Medical History  Diagnosis Date  . Anxiety   . Insomnia   . Endometriosis     s/p hysterectomy   Past Surgical History  Procedure Laterality Date  . Abdominal hysterectomy    . Appendectomy     History   Social History  . Marital Status: Single    Spouse Name: n/a    Number of Children: 1  . Years of Education: 12   Occupational History  . Careers information officer Unemployed   Social History Main Topics  . Smoking status: Current Every Day Smoker -- 0.30 packs/day for 6 years  . Smokeless tobacco: Never Used     Comment: smokes for stress relief, down to 2 cigarettes daily  . Alcohol Use: No  . Drug Use: No  . Sexual Activity: No   Other Topics Concern  . None   Social History Narrative   Lives alone.   Family History  Problem Relation Age of Onset  . Hypertension Mother   . Gout Maternal  Grandmother   . Heart disease Maternal Grandfather   . Hypertension Maternal Grandfather   . Gout Maternal Grandfather   . Seizures Sister 43    undetermined etiology   No Known Allergies Prior to Admission medications   Medication Sig Start Date End Date Taking? Authorizing Provider  fluticasone (FLONASE) 50 MCG/ACT nasal spray Place 2 sprays into both nostrils daily. 03/19/14  Yes Chelle S Jeffery, PA-C  HYDROcodone-homatropine (HYCODAN) 5-1.5 MG/5ML syrup Take 5 mLs by mouth every 8 (eight) hours as needed for cough. 04/21/14  Yes Shawnee Knapp, MD  ipratropium (ATROVENT) 0.06 % nasal spray Place 2 sprays into the nose 3 (three) times daily. 12/04/13  Yes Ryan M Dunn, PA-C  LORazepam (ATIVAN) 1 MG tablet Take 1 tablet (1 mg total) by mouth 2 (two) times daily as needed for anxiety. 04/18/14  Yes Chelle S Jeffery, PA-C  oxybutynin (DITROPAN) 5 MG tablet Take 1 tablet (5 mg total) by mouth at bedtime. 03/21/14  Yes Chelle S Jeffery, PA-C     ROS: The patient denies   night sweats, unintentional weight loss, chest pain, palpitations, wheezing, dyspnea on exertion, nausea, vomiting, abdominal pain, dysuria, hematuria,  melena, numbness, weakness, or tingling.   All other systems have been reviewed and were otherwise negative with the exception of those mentioned in the HPI and as above.    PHYSICAL EXAM: Filed Vitals:   04/29/14 0948  BP: 106/72  Pulse: 89  Temp: 98.2 F (36.8 C)  Resp: 16   Filed Vitals:   04/29/14 0948  Height: 5' 6.5" (1.689 m)  Weight: 200 lb (90.719 kg)   Body mass index is 31.8 kg/(m^2).  General: Alert, no acute distress HEENT:  Normocephalic, atraumatic, oropharynx patent. EOMI, PERRLA. + left tm slighty erytheostuous, she has significant sinus tenedrness, has poor dentition, no abscess Cardiovascular:  Regular rate and rhythm, no rubs murmurs or gallops.  No Carotid bruits, radial pulse intact. No pedal edema.  Respiratory: Clear to auscultation bilaterally.  No  wheezes, rales, or rhonchi.  No cyanosis, no use of accessory musculature GI: No organomegaly, abdomen is soft and non-tender, positive bowel sounds.  No masses. Skin: No rashes. Neurologic: Facial musculature symmetric. Psychiatric: Patient is appropriate throughout our interaction. Lymphatic: No cervical lymphadenopathy Musculoskeletal: Gait intact.   LABS: Results for orders placed in visit on 04/21/14  CBC WITH DIFFERENTIAL      Result Value Ref Range   WBC 5.6  4.0 - 10.5 K/uL   RBC 4.58  3.87 - 5.11 MIL/uL   Hemoglobin 14.7  12.0 - 15.0 g/dL   HCT 43.0  36.0 - 46.0 %   MCV 93.9  78.0 - 100.0 fL   MCH 32.1  26.0 - 34.0 pg   MCHC 34.2  30.0 - 36.0 g/dL   RDW 13.5  11.5 - 15.5 %   Platelets 220  150 - 400 K/uL   Neutrophils Relative % 43  43 - 77 %   Neutro Abs 2.4  1.7 - 7.7 K/uL   Lymphocytes Relative 46  12 - 46 %   Lymphs Abs 2.6  0.7 - 4.0 K/uL   Monocytes Relative 8  3 - 12 %   Monocytes Absolute 0.4  0.1 - 1.0 K/uL   Eosinophils Relative 3  0 - 5 %   Eosinophils Absolute 0.2  0.0 - 0.7 K/uL   Basophils Relative 0  0 - 1 %   Basophils Absolute 0.0  0.0 - 0.1 K/uL   Smear Review Criteria for review not met       EKG/XRAY:   Primary read interpreted by Dr. Marin Comment at [facility].   ASSESSMENT/PLAN: Encounter Diagnoses  Name Primary?  . Acute nonsuppurative otitis media of left ear Yes  . Acute ethmoidal sinusitis, recurrence not specified   . Dental caries   . Acute pharyngitis, unspecified pharyngitis type    OM vs sinusitis and possibly dental caries. She states she does not have any tooth pain but she has significan tdental caries Rx Amoxacillin for coverage for all, dental resources given Rx Vicopren DC hycodan since not effective F/u prn Work note given   Gross sideeffects, risk and benefits, and alternatives of medications d/w patient. Patient is aware that all medications have potential sideeffects and we are unable to predict every sideeffect or drug-drug  interaction that may occur.  , [place], DO 04/29/2014 11:14 AM

## 2014-06-16 ENCOUNTER — Encounter: Payer: Self-pay | Admitting: Physician Assistant

## 2014-06-16 DIAGNOSIS — G4700 Insomnia, unspecified: Secondary | ICD-10-CM

## 2014-06-17 MED ORDER — LORAZEPAM 1 MG PO TABS: 1.0000 mg | ORAL_TABLET | Freq: Two times a day (BID) | ORAL | Status: DC | PRN

## 2014-06-17 NOTE — Telephone Encounter (Signed)
Patient notified via My Chart.  Meds ordered this encounter  Medications  . LORazepam (ATIVAN) 1 MG tablet    Sig: Take 1 tablet (1 mg total) by mouth 2 (two) times daily as needed for anxiety.    Dispense:  30 tablet    Refill:  0    Order Specific Question:  Supervising Provider    Answer:  DOOLITTLE, ROBERT P [[id number]]    

## 2014-06-18 NOTE — Telephone Encounter (Signed)
Faxed

## 2014-08-02 ENCOUNTER — Other Ambulatory Visit: Payer: Self-pay

## 2014-08-12 ENCOUNTER — Encounter: Payer: Self-pay | Admitting: Physician Assistant

## 2014-08-12 DIAGNOSIS — G4700 Insomnia, unspecified: Secondary | ICD-10-CM

## 2014-08-12 MED ORDER — LORAZEPAM 1 MG PO TABS: 1.0000 mg | ORAL_TABLET | Freq: Two times a day (BID) | ORAL | Status: DC | PRN

## 2014-08-12 NOTE — Telephone Encounter (Signed)
Patient notified via My Chart.  Meds ordered this encounter  Medications  . LORazepam (ATIVAN) 1 MG tablet    Sig: Take 1 tablet (1 mg total) by mouth 2 (two) times daily as needed for anxiety.    Dispense:  30 tablet    Refill:  0    Order Specific Question:  Supervising Provider    Answer:  DOOLITTLE, ROBERT P [[id number]]

## 2014-08-13 NOTE — Telephone Encounter (Signed)
Called in.

## 2014-10-21 ENCOUNTER — Ambulatory Visit (INDEPENDENT_AMBULATORY_CARE_PROVIDER_SITE_OTHER): Payer: BLUE CROSS/BLUE SHIELD | Admitting: Physician Assistant

## 2014-10-21 VITALS — BP 124/76 | HR 87 | Temp 97.8°F | Resp 18 | Ht 67.0 in | Wt 208.0 lb

## 2014-10-21 DIAGNOSIS — R350 Frequency of micturition: Secondary | ICD-10-CM

## 2014-10-21 DIAGNOSIS — R351 Nocturia: Secondary | ICD-10-CM

## 2014-10-21 DIAGNOSIS — H9202 Otalgia, left ear: Secondary | ICD-10-CM

## 2014-10-21 DIAGNOSIS — G4700 Insomnia, unspecified: Secondary | ICD-10-CM

## 2014-10-21 DIAGNOSIS — J019 Acute sinusitis, unspecified: Secondary | ICD-10-CM

## 2014-10-21 DIAGNOSIS — R3915 Urgency of urination: Secondary | ICD-10-CM

## 2014-10-21 LAB — POCT UA - MICROSCOPIC ONLY
Casts, Ur, LPF, POC: NEGATIVE
Crystals, Ur, HPF, POC: NEGATIVE
Mucus, UA: NEGATIVE
Yeast, UA: NEGATIVE

## 2014-10-21 LAB — POCT URINALYSIS DIPSTICK
Blood, UA: NEGATIVE
GLUCOSE UA: NEGATIVE
Ketones, UA: 15
LEUKOCYTES UA: NEGATIVE
Nitrite, UA: NEGATIVE
Urobilinogen, UA: 1
pH, UA: 5.5

## 2014-10-21 LAB — GLUCOSE, POCT (MANUAL RESULT ENTRY): POC GLUCOSE: 97 mg/dL (ref 70–99)

## 2014-10-21 MED ORDER — OXYBUTYNIN CHLORIDE ER 15 MG PO TB24: 15.0000 mg | ORAL_TABLET | Freq: Every day | ORAL | Status: DC

## 2014-10-21 MED ORDER — AMOXICILLIN-POT CLAVULANATE 875-125 MG PO TABS: 1.0000 | ORAL_TABLET | Freq: Two times a day (BID) | ORAL | Status: AC

## 2014-10-21 MED ORDER — LORAZEPAM 1 MG PO TABS: 1.0000 mg | ORAL_TABLET | Freq: Two times a day (BID) | ORAL | Status: DC | PRN

## 2014-10-21 MED ORDER — HYDROCODONE-ACETAMINOPHEN 5-325 MG PO TABS: 1.0000 | ORAL_TABLET | Freq: Three times a day (TID) | ORAL | Status: DC | PRN

## 2014-10-21 NOTE — Patient Instructions (Signed)
Stop the Amoxicillin. Take the Augmentin (amoxicillin +clavulanate) instead. Resume the ipratropium bromide (Atrovent) nasal spray. Use OTC Mucinex. Drink lots of water (but not in the evenings!).  Stop the oxybutynin 5 mg. Take the oxybutynin ER 15 mg at bedtime instead.

## 2014-10-21 NOTE — Progress Notes (Signed)
Subjective:    Patient ID: Rebecca Grant, female    DOB: Jul 02, 1966, 49 y.o.   MRN: [id number]   PCP: Ludene Stokke, PA-C  Chief Complaint  Patient presents with  . Ear Pain    left, x 2 days  . Sore Throat    No Known Allergies  Patient Active Problem List   Diagnosis Date Noted  . Nocturia 03/19/2014  . Obesity (BMI 30-39.9) 03/19/2014  . Anxiety 03/08/2013  . Insomnia     Prior to Admission medications   Medication Sig Start Date End Date Taking? Authorizing Provider  fluticasone (FLONASE) 50 MCG/ACT nasal spray Place 2 sprays into both nostrils daily. 03/19/14  Yes Lamine Laton S Yoanna Jurczyk, PA-C  HYDROcodone-acetaminophen (NORCO) 5-325 MG per tablet Take 1 tablet by mouth every 8 (eight) hours as needed for moderate pain. 04/29/14  Yes Thao P Le, DO  ipratropium (ATROVENT) 0.06 % nasal spray Place 2 sprays into the nose 3 (three) times daily. 12/04/13  Yes Ryan M Dunn, PA-C  LORazepam (ATIVAN) 1 MG tablet Take 1 tablet (1 mg total) by mouth 2 (two) times daily as needed for anxiety. 08/12/14  Yes Sebastin Perlmutter S Sylvain Hasten, PA-C  oxybutynin (DITROPAN) 5 MG tablet Take 1 tablet (5 mg total) by mouth at bedtime. 03/21/14  Yes Fernande Bras, PA-C    Medical, Surgical, Family and Social History reviewed and updated.  HPI  2 days of ear pain. Pain is such that she cannot sleep. "My face, the bones around my eyes and my nose hurt so bad." Also reports nasal congestion, post-nasal drainage and sore throat. No cough. No fever/chills. No dizziness. No visual disturbances.  Received 20 refills on prescription of Amoxicillin (875 mg, 1 PO BID x 10 days) given in July 2015 for AOM, which I suspect was inadvertent. Started back on antibiotic yesterday, and has had 3 doses, but without any improvement.  Continues to have nocturia, every 90 minutes or so. As soon as she wakes up she has the strong urge to urinate. Ditropan 5 mg not really help, and since I saw her in 03/2014 she notes that she also has  urinary urgency during the day. She limits her liquids (initially just in the evening, but now all the time) due to the urgency and frequency. No dysuria. No hematuria. No atypical vaginal discharge. She is not sexually active.  Insomnia persists. Lorazepam isn't very effective. Having to take 2, and then helpful, but still wakes up. Can fall asleep, just can't stay asleep. Increased stress at work. She thinks that she is waking up to urinate, not waking up from stress.  Complains of weight gain. "I snack a lot." Very active at work, on her feet all day, but no intentional exercise. Craves starches. Doesn't have "food" in her house because she lives alone, but does keep snacks.  Chart review reveals that her weight has fluctuated over the past two years, as low as 189, as high as 213.  Review of Systems As above.    Objective:   Physical Exam  Constitutional: She is oriented to person, place, and time. She appears well-developed and well-nourished. No distress.  HENT:  Head: Normocephalic and atraumatic.  Right Ear: Hearing, tympanic membrane, external ear and ear canal normal.  Left Ear: Hearing, tympanic membrane, external ear and ear canal normal.  Nose: Right sinus exhibits maxillary sinus tenderness and frontal sinus tenderness. Left sinus exhibits maxillary sinus tenderness and frontal sinus tenderness.  Mouth/Throat: Uvula is midline, oropharynx is  clear and moist and mucous membranes are normal. No oropharyngeal exudate.  Eyes: Conjunctivae, EOM and lids are normal. Pupils are equal, round, and reactive to light. No scleral icterus.  Neck: Normal range of motion and full passive range of motion without pain. Neck supple. No thyroid mass and no thyromegaly present.  Cardiovascular: Normal rate, regular rhythm, normal heart sounds and intact distal pulses.   Pulmonary/Chest: Effort normal and breath sounds normal.  Lymphadenopathy:    She has no cervical adenopathy.  Neurological: She  is alert and oriented to person, place, and time.  Skin: Skin is warm and dry.  Psychiatric: She has a normal mood and affect. Her behavior is normal.  Vitals reviewed.     Results for orders placed or performed in visit on 10/21/14  POCT UA - Microscopic Only  Result Value Ref Range   WBC, Ur, HPF, POC 1-3    RBC, urine, microscopic 0-1    Bacteria, U Microscopic trace    Mucus, UA neg    Epithelial cells, urine per micros 1-2    Crystals, Ur, HPF, POC neg    Casts, Ur, LPF, POC neg    Yeast, UA neg   POCT urinalysis dipstick  Result Value Ref Range   Color, UA yellow    Clarity, UA clear    Glucose, UA neg    Bilirubin, UA small    Ketones, UA 15    Spec Grav, UA >=1.030    Blood, UA neg    pH, UA 5.5    Protein, UA trace    Urobilinogen, UA 1.0    Nitrite, UA neg    Leukocytes, UA Negative   POCT glucose (manual entry)  Result Value Ref Range   POC Glucose 97 70 - 99 mg/dl       Assessment & Plan:  1. Otalgia of left ear No AOM. Suspect pain is due to sinusitis. - HYDROcodone-acetaminophen (NORCO) 5-325 MG per tablet; Take 1 tablet by mouth every 8 (eight) hours as needed for moderate pain.  Dispense: 30 tablet; Refill: 0  2. Acute sinusitis, recurrence not specified, unspecified location Stop Amoxicillin. Replace with Augmentin. OTC Mucinex. Hydrate (mostly during the day to prevent exacerbation of nocturia). Resume Atrovent nasal spray. If no significant improvement in 48 hours, RTC. - amoxicillin-clavulanate (AUGMENTIN) 875-125 MG per tablet; Take 1 tablet by mouth 2 (two) times daily.  Dispense: 20 tablet; Refill: 0  3. Urinary frequency 4. Nocturia 5. Urinary urgency Change from immediate release oxybutynin 5 mg to XR at 15 mg at HS. If no improvement, plan referral to urology. She may also benefit from pelvic floor PT. - POCT UA - Microscopic Only - POCT urinalysis dipstick - POCT glucose (manual entry) - oxybutynin (DITROPAN XL) 15 MG 24 hr tablet;  Take 1 tablet (15 mg total) by mouth at bedtime.  Dispense: 30 tablet; Refill: 3  6. Insomnia Likely due to nocturia. Once that issue is resolved, if symptoms persist, will adjust regimen. - LORazepam (ATIVAN) 1 MG tablet; Take 1 tablet (1 mg total) by mouth 2 (two) times daily as needed for anxiety.  Dispense: 30 tablet; Refill: 0  Obesity. She needs to increase her intentional physical activity and work on [employer]. Counseled on both.  Return in about 1 month (around 11/21/2014) for re-evaluation.  Fernande Bras, PA-C Physician Assistant-Certified [facility]

## 2014-12-06 ENCOUNTER — Telehealth: Payer: Self-pay

## 2014-12-06 DIAGNOSIS — G4700 Insomnia, unspecified: Secondary | ICD-10-CM

## 2014-12-06 MED ORDER — LORAZEPAM 1 MG PO TABS: 1.0000 mg | ORAL_TABLET | Freq: Two times a day (BID) | ORAL | Status: DC | PRN

## 2014-12-06 NOTE — Telephone Encounter (Signed)
Pt called requesting a refill of ativan.

## 2014-12-06 NOTE — Telephone Encounter (Signed)
rx printed  Meds ordered this encounter  Medications  . LORazepam (ATIVAN) 1 MG tablet    Sig: Take 1 tablet (1 mg total) by mouth 2 (two) times daily as needed for anxiety.    Dispense:  30 tablet    Refill:  0    Order Specific Question:  Supervising Provider    Answer:  DOOLITTLE, ROBERT P [[id number]]

## 2014-12-09 NOTE — Telephone Encounter (Signed)
Notified pt RF was faxed. 

## 2014-12-28 ENCOUNTER — Ambulatory Visit (INDEPENDENT_AMBULATORY_CARE_PROVIDER_SITE_OTHER): Payer: BLUE CROSS/BLUE SHIELD | Admitting: Family Medicine

## 2014-12-28 VITALS — BP 110/72 | HR 77 | Temp 97.4°F | Resp 16 | Ht 67.0 in | Wt 205.0 lb

## 2014-12-28 DIAGNOSIS — M545 Low back pain, unspecified: Secondary | ICD-10-CM

## 2014-12-28 DIAGNOSIS — G4700 Insomnia, unspecified: Secondary | ICD-10-CM

## 2014-12-28 DIAGNOSIS — S39012A Strain of muscle, fascia and tendon of lower back, initial encounter: Secondary | ICD-10-CM | POA: Diagnosis not present

## 2014-12-28 DIAGNOSIS — Z23 Encounter for immunization: Secondary | ICD-10-CM

## 2014-12-28 MED ORDER — LORAZEPAM 1 MG PO TABS: 1.0000 mg | ORAL_TABLET | Freq: Two times a day (BID) | ORAL | Status: DC | PRN

## 2014-12-28 MED ORDER — METHOCARBAMOL 500 MG PO TABS: 500.0000 mg | ORAL_TABLET | Freq: Four times a day (QID) | ORAL | Status: DC

## 2014-12-28 MED ORDER — KETOROLAC TROMETHAMINE 60 MG/2ML IM SOLN
60.0000 mg | Freq: Once | INTRAMUSCULAR | Status: AC
  Administered 2014-12-28: 60 mg via INTRAMUSCULAR

## 2014-12-28 MED ORDER — IBUPROFEN 800 MG PO TABS: 800.0000 mg | ORAL_TABLET | Freq: Three times a day (TID) | ORAL | Status: DC

## 2014-12-28 MED ORDER — HYDROCODONE-ACETAMINOPHEN 5-325 MG PO TABS: 1.0000 | ORAL_TABLET | Freq: Four times a day (QID) | ORAL | Status: DC | PRN

## 2014-12-28 NOTE — Patient Instructions (Signed)
Low Back Sprain with Rehab  A sprain is an injury in which a ligament is torn. The ligaments of the lower back are vulnerable to sprains. However, they are strong and require great force to be injured. These ligaments are important for stabilizing the spinal column. Sprains are classified into three categories. Grade 1 sprains cause pain, but the tendon is not lengthened. Grade 2 sprains include a lengthened ligament, due to the ligament being stretched or partially ruptured. With grade 2 sprains there is still function, although the function may be decreased. Grade 3 sprains involve a complete tear of the tendon or muscle, and function is usually impaired. SYMPTOMS   Severe pain in the lower back.  Sometimes, a feeling of a "pop," "snap," or tear, at the time of injury.  Tenderness and sometimes swelling at the injury site.  Uncommonly, bruising (contusion) within 48 hours of injury.  Muscle spasms in the back. CAUSES  Low back sprains occur when a force is placed on the ligaments that is greater than they can handle. Common causes of injury include:  Performing a stressful act while off-balance.  Repetitive stressful activities that involve movement of the lower back.  Direct hit (trauma) to the lower back. RISK INCREASES WITH:  Contact sports (football, wrestling).  Collisions (major skiing accidents).  Sports that require throwing or lifting (baseball, weightlifting).  Sports involving twisting of the spine (gymnastics, diving, tennis, golf).  Poor strength and flexibility.  Inadequate protection.  Previous back injury or surgery (especially fusion). PREVENTION  Wear properly fitted and padded protective equipment.  Warm up and stretch properly before activity.  Allow for adequate recovery between workouts.  Maintain physical fitness:  Strength, flexibility, and endurance.  Cardiovascular fitness.  Maintain a healthy body weight. PROGNOSIS  If treated  properly, low back sprains usually heal with non-surgical treatment. The length of time for healing depends on the severity of the injury.  RELATED COMPLICATIONS   Recurring symptoms, resulting in a chronic problem.  Chronic inflammation and pain in the low back.  Delayed healing or resolution of symptoms, especially if activity is resumed too soon.  Prolonged impairment.  Unstable or arthritic joints of the low back. TREATMENT  Treatment first involves the use of ice and medicine, to reduce pain and inflammation. The use of strengthening and stretching exercises may help reduce pain with activity. These exercises may be performed at home or with a therapist. Severe injuries may require referral to a therapist for further evaluation and treatment, such as ultrasound. Your caregiver may advise that you wear a back brace or corset, to help reduce pain and discomfort. Often, prolonged bed rest results in greater harm then benefit. Corticosteroid injections may be recommended. However, these should be reserved for the most serious cases. It is important to avoid using your back when lifting objects. At night, sleep on your back on a firm mattress, with a pillow placed under your knees. If non-surgical treatment is unsuccessful, surgery may be needed.  MEDICATION   If pain medicine is needed, nonsteroidal anti-inflammatory medicines (aspirin and ibuprofen), or other minor pain relievers (acetaminophen), are often advised.  Do not take pain medicine for 7 days before surgery.  Prescription pain relievers may be given, if your caregiver thinks they are needed. Use only as directed and only as much as you need.  Ointments applied to the skin may be helpful.  Corticosteroid injections may be given by your caregiver. These injections should be reserved for the most serious cases,   because they may only be given a certain number of times. HEAT AND COLD  Cold treatment (icing) should be applied for 10  to 15 minutes every 2 to 3 hours for inflammation and pain, and immediately after activity that aggravates your symptoms. Use ice packs or an ice massage.  Heat treatment may be used before performing stretching and strengthening activities prescribed by your caregiver, physical therapist, or athletic trainer. Use a heat pack or a warm water soak. SEEK MEDICAL CARE IF:   Symptoms get worse or do not improve in 2 to 4 weeks, despite treatment.  You develop numbness or weakness in either leg.  You lose bowel or bladder function.  Any of the following occur after surgery: fever, increased pain, swelling, redness, drainage of fluids, or bleeding in the affected area.  New, unexplained symptoms develop. (Drugs used in treatment may produce side effects.) EXERCISES  RANGE OF MOTION (ROM) AND STRETCHING EXERCISES - Low Back Sprain Most people with lower back pain will find that their symptoms get worse with excessive bending forward (flexion) or arching at the lower back (extension). The exercises that will help resolve your symptoms will focus on the opposite motion.  Your physician, physical therapist or athletic trainer will help you determine which exercises will be most helpful to resolve your lower back pain. Do not complete any exercises without first consulting with your caregiver. Discontinue any exercises which make your symptoms worse, until you speak to your caregiver. If you have pain, numbness or tingling which travels down into your buttocks, leg or foot, the goal of the therapy is for these symptoms to move closer to your back and eventually resolve. Sometimes, these leg symptoms will get better, but your lower back pain may worsen. This is often an indication of progress in your rehabilitation. Be very alert to any changes in your symptoms and the activities in which you participated in the 24 hours prior to the change. Sharing this information with your caregiver will allow him or her to  most efficiently treat your condition. These exercises may help you when beginning to rehabilitate your injury. Your symptoms may resolve with or without further involvement from your physician, physical therapist or athletic trainer. While completing these exercises, remember:   Restoring tissue flexibility helps normal motion to return to the joints. This allows healthier, less painful movement and activity.  An effective stretch should be held for at least 30 seconds.  A stretch should never be painful. You should only feel a gentle lengthening or release in the stretched tissue. FLEXION RANGE OF MOTION AND STRETCHING EXERCISES: STRETCH - Flexion, Single Knee to Chest   Lie on a firm bed or floor with both legs extended in front of you.  Keeping one leg in contact with the floor, bring your opposite knee to your chest. Hold your leg in place by either grabbing behind your thigh or at your knee.  Pull until you feel a gentle stretch in your low back. Hold __________ seconds.  Slowly release your grasp and repeat the exercise with the opposite side. Repeat __________ times. Complete this exercise __________ times per day.  STRETCH - Flexion, Double Knee to Chest  Lie on a firm bed or floor with both legs extended in front of you.  Keeping one leg in contact with the floor, bring your opposite knee to your chest.  Tense your stomach muscles to support your back and then lift your other knee to your chest. Hold your legs   in place by either grabbing behind your thighs or at your knees.  Pull both knees toward your chest until you feel a gentle stretch in your low back. Hold __________ seconds.  Tense your stomach muscles and slowly return one leg at a time to the floor. Repeat __________ times. Complete this exercise __________ times per day.  STRETCH - Low Trunk Rotation  Lie on a firm bed or floor. Keeping your legs in front of you, bend your knees so they are both pointed toward the  ceiling and your feet are flat on the floor.  Extend your arms out to the side. This will stabilize your upper body by keeping your shoulders in contact with the floor.  Gently and slowly drop both knees together to one side until you feel a gentle stretch in your low back. Hold for __________ seconds.  Tense your stomach muscles to support your lower back as you bring your knees back to the starting position. Repeat the exercise to the other side. Repeat __________ times. Complete this exercise __________ times per day  EXTENSION RANGE OF MOTION AND FLEXIBILITY EXERCISES: STRETCH - Extension, Prone on Elbows   Lie on your stomach on the floor, a bed will be too soft. Place your palms about shoulder width apart and at the height of your head.  Place your elbows under your shoulders. If this is too painful, stack pillows under your chest.  Allow your body to relax so that your hips drop lower and make contact more completely with the floor.  Hold this position for __________ seconds.  Slowly return to lying flat on the floor. Repeat __________ times. Complete this exercise __________ times per day.  RANGE OF MOTION - Extension, Prone Press Ups  Lie on your stomach on the floor, a bed will be too soft. Place your palms about shoulder width apart and at the height of your head.  Keeping your back as relaxed as possible, slowly straighten your elbows while keeping your hips on the floor. You may adjust the placement of your hands to maximize your comfort. As you gain motion, your hands will come more underneath your shoulders.  Hold this position __________ seconds.  Slowly return to lying flat on the floor. Repeat __________ times. Complete this exercise __________ times per day.  RANGE OF MOTION- Quadruped, Neutral Spine   Assume a hands and knees position on a firm surface. Keep your hands under your shoulders and your knees under your hips. You may place padding under your knees for  comfort.  Drop your head and point your tailbone toward the ground below you. This will round out your lower back like an angry cat. Hold this position for __________ seconds.  Slowly lift your head and release your tail bone so that your back sags into a large arch, like an old horse.  Hold this position for __________ seconds.  Repeat this until you feel limber in your low back.  Now, find your "sweet spot." This will be the most comfortable position somewhere between the two previous positions. This is your neutral spine. Once you have found this position, tense your stomach muscles to support your low back.  Hold this position for __________ seconds. Repeat __________ times. Complete this exercise __________ times per day.  STRENGTHENING EXERCISES - Low Back Sprain These exercises may help you when beginning to rehabilitate your injury. These exercises should be done near your "sweet spot." This is the neutral, low-back arch, somewhere between fully rounded   and fully arched, that is your least painful position. When performed in this safe range of motion, these exercises can be used for people who have either a flexion or extension based injury. These exercises may resolve your symptoms with or without further involvement from your physician, physical therapist or athletic trainer. While completing these exercises, remember:   Muscles can gain both the endurance and the strength needed for everyday activities through controlled exercises.  Complete these exercises as instructed by your physician, physical therapist or athletic trainer. Increase the resistance and repetitions only as guided.  You may experience muscle soreness or fatigue, but the pain or discomfort you are trying to eliminate should never worsen during these exercises. If this pain does worsen, stop and make certain you are following the directions exactly. If the pain is still present after adjustments, discontinue the  exercise until you can discuss the trouble with your caregiver. STRENGTHENING - Deep Abdominals, Pelvic Tilt   Lie on a firm bed or floor. Keeping your legs in front of you, bend your knees so they are both pointed toward the ceiling and your feet are flat on the floor.  Tense your lower abdominal muscles to press your low back into the floor. This motion will rotate your pelvis so that your tail bone is scooping upwards rather than pointing at your feet or into the floor. With a gentle tension and even breathing, hold this position for __________ seconds. Repeat __________ times. Complete this exercise __________ times per day.  STRENGTHENING - Abdominals, Crunches   Lie on a firm bed or floor. Keeping your legs in front of you, bend your knees so they are both pointed toward the ceiling and your feet are flat on the floor. Cross your arms over your chest.  Slightly tip your chin down without bending your neck.  Tense your abdominals and slowly lift your trunk high enough to just clear your shoulder blades. Lifting higher can put excessive stress on the lower back and does not further strengthen your abdominal muscles.  Control your return to the starting position. Repeat __________ times. Complete this exercise __________ times per day.  STRENGTHENING - Quadruped, Opposite UE/LE Lift   Assume a hands and knees position on a firm surface. Keep your hands under your shoulders and your knees under your hips. You may place padding under your knees for comfort.  Find your neutral spine and gently tense your abdominal muscles so that you can maintain this position. Your shoulders and hips should form a rectangle that is parallel with the floor and is not twisted.  Keeping your trunk steady, lift your right hand no higher than your shoulder and then your left leg no higher than your hip. Make sure you are not holding your breath. Hold this position for __________ seconds.  Continuing to keep  your abdominal muscles tense and your back steady, slowly return to your starting position. Repeat with the opposite arm and leg. Repeat __________ times. Complete this exercise __________ times per day.  STRENGTHENING - Abdominals and Quadriceps, Straight Leg Raise   Lie on a firm bed or floor with both legs extended in front of you.  Keeping one leg in contact with the floor, bend the other knee so that your foot can rest flat on the floor.  Find your neutral spine, and tense your abdominal muscles to maintain your spinal position throughout the exercise.  Slowly lift your straight leg off the floor about 6 inches for a count   of 15, making sure to not hold your breath.  Still keeping your neutral spine, slowly lower your leg all the way to the floor. Repeat this exercise with each leg __________ times. Complete this exercise __________ times per day. POSTURE AND BODY MECHANICS CONSIDERATIONS - Low Back Sprain Keeping correct posture when sitting, standing or completing your activities will reduce the stress put on different body tissues, allowing injured tissues a chance to heal and limiting painful experiences. The following are general guidelines for improved posture. Your physician or physical therapist will provide you with any instructions specific to your needs. While reading these guidelines, remember:  The exercises prescribed by your provider will help you have the flexibility and strength to maintain correct postures.  The correct posture provides the best environment for your joints to work. All of your joints have less wear and tear when properly supported by a spine with good posture. This means you will experience a healthier, less painful body.  Correct posture must be practiced with all of your activities, especially prolonged sitting and standing. Correct posture is as important when doing repetitive low-stress activities (typing) as it is when doing a single heavy-load  activity (lifting). RESTING POSITIONS Consider which positions are most painful for you when choosing a resting position. If you have pain with flexion-based activities (sitting, bending, stooping, squatting), choose a position that allows you to rest in a less flexed posture. You would want to avoid curling into a fetal position on your side. If your pain worsens with extension-based activities (prolonged standing, working overhead), avoid resting in an extended position such as sleeping on your stomach. Most people will find more comfort when they rest with their spine in a more neutral position, neither too rounded nor too arched. Lying on a non-sagging bed on your side with a pillow between your knees, or on your back with a pillow under your knees will often provide some relief. Keep in mind, being in any one position for a prolonged period of time, no matter how correct your posture, can still lead to stiffness. PROPER SITTING POSTURE In order to minimize stress and discomfort on your spine, you must sit with correct posture. Sitting with good posture should be effortless for a healthy body. Returning to good posture is a gradual process. Many people can work toward this most comfortably by using various supports until they have the flexibility and strength to maintain this posture on their own. When sitting with proper posture, your ears will fall over your shoulders and your shoulders will fall over your hips. You should use the back of the chair to support your upper back. Your lower back will be in a neutral position, just slightly arched. You may place a small pillow or folded towel at the base of your lower back for  support.  When working at a desk, create an environment that supports good, upright posture. Without extra support, muscles tire, which leads to excessive strain on joints and other tissues. Keep these recommendations in mind: CHAIR:  A chair should be able to slide under your desk  when your back makes contact with the back of the chair. This allows you to work closely.  The chair's height should allow your eyes to be level with the upper part of your monitor and your hands to be slightly lower than your elbows. BODY POSITION  Your feet should make contact with the floor. If this is not possible, use a foot rest.  Keep your   ears over your shoulders. This will reduce stress on your neck and low back. INCORRECT SITTING POSTURES  If you are feeling tired and unable to assume a healthy sitting posture, do not slouch or slump. This puts excessive strain on your back tissues, causing more damage and pain. Healthier options include:  Using more support, like a lumbar pillow.  Switching tasks to something that requires you to be upright or walking.  Talking a brief walk.  Lying down to rest in a neutral-spine position. PROLONGED STANDING WHILE SLIGHTLY LEANING FORWARD  When completing a task that requires you to lean forward while standing in one place for a long time, place either foot up on a stationary 2-4 inch high object to help maintain the best posture. When both feet are on the ground, the lower back tends to lose its slight inward curve. If this curve flattens (or becomes too large), then the back and your other joints will experience too much stress, tire more quickly, and can cause pain. CORRECT STANDING POSTURES Proper standing posture should be assumed with all daily activities, even if they only take a few moments, like when brushing your teeth. As in sitting, your ears should fall over your shoulders and your shoulders should fall over your hips. You should keep a slight tension in your abdominal muscles to brace your spine. Your tailbone should point down to the ground, not behind your body, resulting in an over-extended swayback posture.  INCORRECT STANDING POSTURES  Common incorrect standing postures include a forward head, locked knees and/or an excessive  swayback. WALKING Walk with an upright posture. Your ears, shoulders and hips should all line-up. PROLONGED ACTIVITY IN A FLEXED POSITION When completing a task that requires you to bend forward at your waist or lean over a low surface, try to find a way to stabilize 3 out of 4 of your limbs. You can place a hand or elbow on your thigh or rest a knee on the surface you are reaching across. This will provide you more stability, so that your muscles do not tire as quickly. By keeping your knees relaxed, or slightly bent, you will also reduce stress across your lower back. CORRECT LIFTING TECHNIQUES DO :  Assume a wide stance. This will provide you more stability and the opportunity to get as close as possible to the object which you are lifting.  Tense your abdominals to brace your spine. Bend at the knees and hips. Keeping your back locked in a neutral-spine position, lift using your leg muscles. Lift with your legs, keeping your back straight.  Test the weight of unknown objects before attempting to lift them.  Try to keep your elbows locked down at your sides in order get the best strength from your shoulders when carrying an object.  Always ask for help when lifting heavy or awkward objects. INCORRECT LIFTING TECHNIQUES DO NOT:   Lock your knees when lifting, even if it is a small object.  Bend and twist. Pivot at your feet or move your feet when needing to change directions.  Assume that you can safely pick up even a paperclip without proper posture. Document Released: 10/04/2005 Document Revised: 12/27/2011 Document Reviewed: 01/16/2009 [employer] Patient Information 2015 [employer]. This information is not intended to replace advice given to you by your health care provider. Make sure you discuss any questions you have with your health care provider.  

## 2014-12-28 NOTE — Progress Notes (Signed)
Subjective:    Patient ID: Rebecca Grant, female    DOB: May 19, 1966, 49 y.o.   MRN: [id number]  12/28/2014  Back Pain and Medication Refill This chart was scribed for Ethelda Chick, MD by [place] Peace, ED Scribe. The patient was seen in [facility]. The patient's care was started at 10:59 AM.    HPI  HPI Comments: Rebecca Grant is a 49 y.o. female who presents to the [facility] complaining of constant back pain onset Wednesday/three days ago. She reports history of back pain dating back to 5 or 6 years ago that she states was related to a disc problem; she has suffered no recurrent back pain for past five years until now. Pain radiates down her legs bilaterally to B posterior hamstrings, rated as 9/10 at it's worst and 8/10 currently. She describes pain as "burning" sensation at times.  Pt notes she has tried taking  of Motrin (2x yesterday), Aleve, and applying heat compresses to affected area without any relief. Pt is an International aid/development worker at [employer] and explains she does a lot of standing throughout the day but rare lifting. No complaints of urinary issues or bowel incontinence. She denies any recent falls or mechanisms or injuries that may be responsible for current pain. She further denies taking any medication today pta. Pt is current everyday smoker.   Pt is requesting refill of Lorazepam.  She is taking 2 Lorazepam qhs for insomnia.  She has been prescribed 30 tablets at a time yet this does not last a full 30 days.  She is questioning why rx written for 30 tablets only.  Per Porfirio Oar, PA-C note from 10/2014, Ativan prescribed for insomnia that was partially related to nocturia. Oxybutynin prescribed with some benefit yet patient continues to suffer with insomnia. Pt drinks one serving of caffeine every morning only.  Admits to very stressful job.     Review of Systems  Constitutional: Negative for fever, chills, diaphoresis and fatigue.  Gastrointestinal: Negative for diarrhea,  constipation and blood in stool.  Genitourinary: Negative for dysuria, urgency, frequency, hematuria, decreased urine volume and difficulty urinating.  Musculoskeletal: Positive for back pain. Negative for gait problem.  Skin: Negative for rash.  Neurological: Negative for weakness and numbness.  Psychiatric/Behavioral: Positive for sleep disturbance. Negative for dysphoric mood. The patient is nervous/anxious.     Past Medical History  Diagnosis Date  . Anxiety   . Insomnia   . Endometriosis     s/p hysterectomy   Past Surgical History  Procedure Laterality Date  . Abdominal hysterectomy    . Appendectomy     No Known Allergies Current Outpatient Prescriptions  Medication Sig Dispense Refill  . fluticasone (FLONASE) 50 MCG/ACT nasal spray Place 2 sprays into both nostrils daily. 16 g 12  . ipratropium (ATROVENT) 0.06 % nasal spray Place 2 sprays into the nose 3 (three) times daily. 15 mL 5  . LORazepam (ATIVAN) 1 MG tablet Take 1 tablet (1 mg total) by mouth 2 (two) times daily as needed for anxiety or sleep. 60 tablet 0  . oxybutynin (DITROPAN XL) 15 MG 24 hr tablet Take 1 tablet (15 mg total) by mouth at bedtime. 30 tablet 3  . HYDROcodone-acetaminophen (NORCO) 5-325 MG per tablet Take 1 tablet by mouth every 6 (six) hours as needed for moderate pain. 30 tablet 0  . ibuprofen (ADVIL,MOTRIN) 800 MG tablet Take 1 tablet (800 mg total) by mouth 3 (three) times daily. 40 tablet 0  . methocarbamol (  ROBAXIN) 500 MG tablet Take 1-2 tablets (500-1,000 mg total) by mouth 4 (four) times daily. 40 tablet 0   No current facility-administered medications for this visit.       Objective:    BP 110/72 mmHg  Pulse 77  Temp(Src) 97.4 F (36.3 C) (Oral)  Resp 16  Ht 5\' 7"  (1.702 m)  Wt 205 lb (92.987 kg)  BMI 32.10 kg/m2  SpO2 98% Physical Exam  Constitutional: She is oriented to person, place, and time. She appears well-developed and well-nourished. No distress.  HENT:  Head:  Normocephalic and atraumatic.  Eyes: Conjunctivae and EOM are normal.  Neck: Neck supple. No tracheal deviation present.  Cardiovascular: Normal rate, regular rhythm and normal heart sounds.   No murmur heard. Pulmonary/Chest: Effort normal and breath sounds normal. No respiratory distress. She has no wheezes. She has no rales.  Musculoskeletal: She exhibits tenderness.       Lumbar back: She exhibits decreased range of motion, tenderness, bony tenderness and pain. She exhibits no swelling, no spasm and normal pulse.  LUMBAR SPINE: +TTP of midline Lumbar Spine. TTP of R paraspinal muscles and L paraspinal muscles.  Decreased ROM throughout due to pain.  Straight leg raises negative; toe and heel walking intact; marching intact.  Motor 5/5 BLE.  Neurological: She is alert and oriented to person, place, and time. She has normal strength. No sensory deficit.  Skin: Skin is warm and dry. She is not diaphoretic.  Psychiatric: She has a normal mood and affect. Her behavior is normal.  Nursing note and vitals reviewed.  TORADOL 60MG  IM ADMINISTERED  INFLUENZA VACCINE ADMINISTERED.     Assessment & Plan:   1. Bilateral low back pain without sciatica   2. Lumbar strain, initial encounter   3. Insomnia      1. Low back pain/strain: New.  Rx for Ibuprofen 800mg  tid, Robaxin 500mg  qid, Hydrocodone provided.  Recommend rest, frequent ambulation and stretching.  Heat to area bid for 15-20 minutes. If no improvement in two weeks, RTC for xrays and PT referral.  OOW note provided for tomorrow.  Start home exercise program in upcoming 72 hours.  S/p Toradol injection in office for pain control. 2. Insomnia: persistent despite improving nocturia; refill of Lorazepam provided for one month; recommend follow-up with PCP. 3. S/p flu vaccine.    Meds ordered this encounter  Medications  . ibuprofen (ADVIL,MOTRIN) 800 MG tablet    Sig: Take 1 tablet (800 mg total) by mouth 3 (three) times daily.     Dispense:  40 tablet    Refill:  0  . methocarbamol (ROBAXIN) 500 MG tablet    Sig: Take 1-2 tablets (500-1,000 mg total) by mouth 4 (four) times daily.    Dispense:  40 tablet    Refill:  0  . HYDROcodone-acetaminophen (NORCO) 5-325 MG per tablet    Sig: Take 1 tablet by mouth every 6 (six) hours as needed for moderate pain.    Dispense:  30 tablet    Refill:  0  . LORazepam (ATIVAN) 1 MG tablet    Sig: Take 1 tablet (1 mg total) by mouth 2 (two) times daily as needed for anxiety or sleep.    Dispense:  60 tablet    Refill:  0  . ketorolac (TORADOL) injection 60 mg    Sig:     Return if symptoms worsen or fail to improve.    I personally performed the services described in this documentation, which was scribed  in my presence. The recorded information has been reviewed and considered.  Kai Calico Paulita Fujita, M.D. [facility] [address] [place], [place]  [address] [phone] phone [phone] fax

## 2015-01-04 ENCOUNTER — Ambulatory Visit (INDEPENDENT_AMBULATORY_CARE_PROVIDER_SITE_OTHER): Payer: BLUE CROSS/BLUE SHIELD | Admitting: Emergency Medicine

## 2015-01-04 VITALS — BP 132/82 | HR 91 | Temp 97.8°F | Resp 18

## 2015-01-04 DIAGNOSIS — M5432 Sciatica, left side: Secondary | ICD-10-CM | POA: Diagnosis not present

## 2015-01-04 DIAGNOSIS — M545 Low back pain, unspecified: Secondary | ICD-10-CM

## 2015-01-04 DIAGNOSIS — S39012D Strain of muscle, fascia and tendon of lower back, subsequent encounter: Secondary | ICD-10-CM | POA: Diagnosis not present

## 2015-01-04 MED ORDER — METHOCARBAMOL 500 MG PO TABS: 1500.0000 mg | ORAL_TABLET | Freq: Four times a day (QID) | ORAL | Status: DC

## 2015-01-04 MED ORDER — OXYCODONE-ACETAMINOPHEN 10-325 MG PO TABS: 1.0000 | ORAL_TABLET | Freq: Three times a day (TID) | ORAL | Status: DC | PRN

## 2015-01-04 MED ORDER — KETOROLAC TROMETHAMINE 60 MG/2ML IM SOLN
60.0000 mg | Freq: Once | INTRAMUSCULAR | Status: AC
  Administered 2015-01-04: 60 mg via INTRAMUSCULAR

## 2015-01-04 NOTE — Progress Notes (Signed)
[facility] [address], [place] [address] [phone]  Date:  01/04/2015   Name:  Rebecca Grant   DOB:  05-19-1966   MRN:  [id number]  PCP:  Bevin Mayall,CHELLE, PA-C    Chief Complaint: Leg Pain   History of Present Illness:  Rebecca Grant is a 49 y.o. very pleasant female patient who presents with the following:  Patient treated last week with hydrocodone and robaxin for sciatic neuritis Pain has increased now and is radicular in nature to ankle down the back of the leg. Foot is numb Fell last night due weakness in leg. No history of injury and has been compliant with home care instructions. No improvement with over the counter medications or other home remedies.  Denies other complaint or health concern today.   Patient Active Problem List   Diagnosis Date Noted  . Nocturia 03/19/2014  . Obesity (BMI 30-39.9) 03/19/2014  . Anxiety 03/08/2013  . Insomnia     Past Medical History  Diagnosis Date  . Anxiety   . Insomnia   . Endometriosis     s/p hysterectomy    Past Surgical History  Procedure Laterality Date  . Abdominal hysterectomy    . Appendectomy      History  Substance Use Topics  . Smoking status: Current Every Day Smoker -- 6 years  . Smokeless tobacco: Never Used     Comment: smokes for stress relief, down to 1 cigarette daily  . Alcohol Use: No    Family History  Problem Relation Age of Onset  . Hypertension Mother   . Gout Maternal Grandmother   . Heart disease Maternal Grandfather   . Hypertension Maternal Grandfather   . Gout Maternal Grandfather   . Seizures Sister 34    undetermined etiology    No Known Allergies  Medication list has been reviewed and updated.  Current Outpatient Prescriptions on File Prior to Visit  Medication Sig Dispense Refill  . fluticasone (FLONASE) 50 MCG/ACT nasal spray Place 2 sprays into both nostrils daily. 16 g 12  . HYDROcodone-acetaminophen (NORCO) 5-325 MG per tablet Take 1  tablet by mouth every 6 (six) hours as needed for moderate pain. 30 tablet 0  . ibuprofen (ADVIL,MOTRIN) 800 MG tablet Take 1 tablet (800 mg total) by mouth 3 (three) times daily. 40 tablet 0  . ipratropium (ATROVENT) 0.06 % nasal spray Place 2 sprays into the nose 3 (three) times daily. 15 mL 5  . LORazepam (ATIVAN) 1 MG tablet Take 1 tablet (1 mg total) by mouth 2 (two) times daily as needed for anxiety or sleep. 60 tablet 0  . methocarbamol (ROBAXIN) 500 MG tablet Take 1-2 tablets (500-1,000 mg total) by mouth 4 (four) times daily. 40 tablet 0  . oxybutynin (DITROPAN XL) 15 MG 24 hr tablet Take 1 tablet (15 mg total) by mouth at bedtime. 30 tablet 3   No current facility-administered medications on file prior to visit.    Review of Systems:  As per HPI, otherwise negative.    Physical Examination: Filed Vitals:   01/04/15 0901  BP: 132/82  Pulse: 91  Temp: 97.8 F (36.6 C)  Resp: 18   There were no vitals filed for this visit. There is no weight on file to calculate BMI. Ideal Body Weight:     GEN: WDWN, marked distress, Non-toxic, Alert & Oriented x 3 HEENT: Atraumatic, Normocephalic.  Ears and Nose: No external deformity. EXTR: No clubbing/cyanosis/edema NEURO: Normal gait.  PSYCH: Normally interactive. Conversant. Not depressed or anxious appearing.  Calm demeanor.  Back tender right sciatic notch.  Extension weakness left knee  Assessment and Plan: Sciatic neuritis  Lumbar strain Stop vicodin Add percocet Increase robaxin to 1.5 gm 4 x a day Follow up Friday by phone if not better will set up for MR  Signed,  Phillips OdorJeffery Alaysha Jefcoat, MD

## 2015-01-05 ENCOUNTER — Emergency Department (HOSPITAL_COMMUNITY): Payer: BC Managed Care – PPO

## 2015-01-05 ENCOUNTER — Encounter (HOSPITAL_COMMUNITY): Payer: Self-pay | Admitting: Emergency Medicine

## 2015-01-05 ENCOUNTER — Emergency Department (HOSPITAL_COMMUNITY)
Admission: EM | Admit: 2015-01-05 | Discharge: 2015-01-05 | Disposition: A | Discharge status: Home / Self Care | Payer: BC Managed Care – PPO | Attending: Emergency Medicine | Admitting: Emergency Medicine

## 2015-01-05 DIAGNOSIS — M79605 Pain in left leg: Secondary | ICD-10-CM | POA: Diagnosis not present

## 2015-01-05 DIAGNOSIS — M5432 Sciatica, left side: Secondary | ICD-10-CM | POA: Insufficient documentation

## 2015-01-05 DIAGNOSIS — F419 Anxiety disorder, unspecified: Secondary | ICD-10-CM | POA: Diagnosis not present

## 2015-01-05 DIAGNOSIS — Z720 Tobacco use: Secondary | ICD-10-CM | POA: Insufficient documentation

## 2015-01-05 DIAGNOSIS — Z8669 Personal history of other diseases of the nervous system and sense organs: Secondary | ICD-10-CM | POA: Insufficient documentation

## 2015-01-05 DIAGNOSIS — Z7951 Long term (current) use of inhaled steroids: Secondary | ICD-10-CM | POA: Insufficient documentation

## 2015-01-05 DIAGNOSIS — Z8742 Personal history of other diseases of the female genital tract: Secondary | ICD-10-CM | POA: Diagnosis not present

## 2015-01-05 DIAGNOSIS — M549 Dorsalgia, unspecified: Secondary | ICD-10-CM | POA: Diagnosis present

## 2015-01-05 DIAGNOSIS — R200 Anesthesia of skin: Secondary | ICD-10-CM | POA: Diagnosis not present

## 2015-01-05 DIAGNOSIS — M5136 Other intervertebral disc degeneration, lumbar region: Secondary | ICD-10-CM | POA: Insufficient documentation

## 2015-01-05 HISTORY — DX: Dorsalgia, unspecified: M54.9

## 2015-01-05 MED ORDER — OXYCODONE-ACETAMINOPHEN 5-325 MG PO TABS: 1.0000 | ORAL_TABLET | Freq: Once | ORAL | Status: DC

## 2015-01-05 NOTE — Discharge Instructions (Signed)
Please call your doctor for a followup appointment within 24-48 hours. When you talk to your doctor please let them know that you were seen in the emergency department and have them acquire all of your records so that they can discuss the findings with you and formulate a treatment plan to fully care for your new and ongoing problems. Please call and set-up an appointment with Orthopedics Please follow up with your primary care provider Please rest and stay hydrated Please avoid any strenuous or physical activity  Please apply heat and massage Please continue to monitor symptoms closely and if symptoms are to worsen or change (fever greater than 101, chills, sweating, nausea, vomiting, chest pain, shortness of breathe, difficulty breathing, weakness, numbness, tingling, worsening or changes to pain pattern, fall, injury, loss of sensation, inability to control urine or bowel movements) please report back to the Emergency Department immediately.   Sciatica Sciatica is pain, weakness, numbness, or tingling along the path of the sciatic nerve. The nerve starts in the lower back and runs down the back of each leg. The nerve controls the muscles in the lower leg and in the back of the knee, while also providing sensation to the back of the thigh, lower leg, and the sole of your foot. Sciatica is a symptom of another medical condition. For instance, nerve damage or certain conditions, such as a herniated disk or bone spur on the spine, pinch or put pressure on the sciatic nerve. This causes the pain, weakness, or other sensations normally associated with sciatica. Generally, sciatica only affects one side of the body. CAUSES   Herniated or slipped disc.  Degenerative disk disease.  A pain disorder involving the narrow muscle in the buttocks (piriformis syndrome).  Pelvic injury or fracture.  Pregnancy.  Tumor (rare). SYMPTOMS  Symptoms can vary from mild to very severe. The symptoms usually travel  from the low back to the buttocks and down the back of the leg. Symptoms can include:  Mild tingling or dull aches in the lower back, leg, or hip.  Numbness in the back of the calf or sole of the foot.  Burning sensations in the lower back, leg, or hip.  Sharp pains in the lower back, leg, or hip.  Leg weakness.  Severe back pain inhibiting movement. These symptoms may get worse with coughing, sneezing, laughing, or prolonged sitting or standing. Also, being overweight may worsen symptoms. DIAGNOSIS  Your caregiver will perform a physical exam to look for common symptoms of sciatica. He or she may ask you to do certain movements or activities that would trigger sciatic nerve pain. Other tests may be performed to find the cause of the sciatica. These may include:  Blood tests.  X-rays.  Imaging tests, such as an MRI or CT scan. TREATMENT  Treatment is directed at the cause of the sciatic pain. Sometimes, treatment is not necessary and the pain and discomfort goes away on its own. If treatment is needed, your caregiver may suggest:  Over-the-counter medicines to relieve pain.  Prescription medicines, such as anti-inflammatory medicine, muscle relaxants, or narcotics.  Applying heat or ice to the painful area.  Steroid injections to lessen pain, irritation, and inflammation around the nerve.  Reducing activity during periods of pain.  Exercising and stretching to strengthen your abdomen and improve flexibility of your spine. Your caregiver may suggest losing weight if the extra weight makes the back pain worse.  Physical therapy.  Surgery to eliminate what is pressing or pinching the  nerve, such as a bone spur or part of a herniated disk. HOME CARE INSTRUCTIONS   Only take over-the-counter or prescription medicines for pain or discomfort as directed by your caregiver.  Apply ice to the affected area for 20 minutes, 3-4 times a day for the first 48-72 hours. Then try heat in  the same way.  Exercise, stretch, or perform your usual activities if these do not aggravate your pain.  Attend physical therapy sessions as directed by your caregiver.  Keep all follow-up appointments as directed by your caregiver.  Do not wear high heels or shoes that do not provide proper support.  Check your mattress to see if it is too soft. A firm mattress may lessen your pain and discomfort. SEEK IMMEDIATE MEDICAL CARE IF:   You lose control of your bowel or bladder (incontinence).  You have increasing weakness in the lower back, pelvis, buttocks, or legs.  You have redness or swelling of your back.  You have a burning sensation when you urinate.  You have pain that gets worse when you lie down or awakens you at night.  Your pain is worse than you have experienced in the past.  Your pain is lasting longer than 4 weeks.  You are suddenly losing weight without reason. MAKE SURE YOU:  Understand these instructions.  Will watch your condition.  Will get help right away if you are not doing well or get worse. Document Released: 09/28/2001 Document Revised: 04/04/2012 Document Reviewed: 02/13/2012 [facility] Patient Information 2015 [place], [place]. This information is not intended to replace advice given to you by your health care provider. Make sure you discuss any questions you have with your health care provider.

## 2015-01-05 NOTE — ED Provider Notes (Signed)
CSN: [id number]     Arrival date & time 01/05/15  4098 History   First MD Initiated Contact with Patient 01/05/15 1029     Chief Complaint  Patient presents with  . Leg Pain    l/leg pain     (Consider location/radiation/quality/duration/timing/severity/associated sxs/prior Treatment) The history is provided by the patient. No language interpreter was used.  Rebecca Grant is a 49 y/o F with PMHx of anxiety, insomnia, endometriosis, backache, history of herniated discs in the lumbar spine 5-6 years ago presenting to the ED with left leg pain. Patient reported that she started having back pain approximately one week ago - stated that the pain was on both sides of the back, but stated that now the pain is localized to the left side of the back and radiates down her left leg. Patient reported that she has a constant shooting, sharp pain down the posterior aspect of her left leg, and stated that her left foot is constantly tingling. Stated that the pain is worse when sitting for long periods of time. 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 Patient reported that on Friday night she went to the bathroom where she fell off because she had weakness in her left leg. Patient reported that she was [facility] and was started on Norco, Perco, and Robaxin without relief. Patient reported that the pain has gotten worse. Denied chest pain, shortness of breath, difficulty breathing, abdominal pain, nausea, vomiting, fall, injuries, urinary and bowel incontinence, red streaks, swelling, fever, chills. LMP hysterectomy.  PCP Dr. Tinnie Gens   Past Medical History  Diagnosis Date  . Anxiety   . Insomnia   . Endometriosis     s/p hysterectomy  . Back ache    Past Surgical History  Procedure Laterality Date  . Abdominal hysterectomy    . Appendectomy     Family History  Problem Relation Age of  Onset  . Hypertension Mother   . Gout Maternal Grandmother   . Heart disease Maternal Grandfather   . Hypertension Maternal Grandfather   . Gout Maternal Grandfather   . Seizures Sister 33    undetermined etiology   History  Substance Use Topics  . Smoking status: Current Every Day Smoker -- 6 years  . Smokeless tobacco: Never Used     Comment: smokes for stress relief, down to 1 cigarette daily  . Alcohol Use: No   OB History    No data available     Review of Systems  Constitutional: Negative for fever and chills.  Respiratory: Negative for chest tightness and shortness of breath.   Cardiovascular: Negative for chest pain.  Gastrointestinal: Negative for nausea, vomiting and abdominal pain.  Musculoskeletal: Positive for back pain. Negative for neck pain and neck stiffness.  Neurological: Positive for numbness. Negative for dizziness, weakness and headaches.      Allergies  Review of patient's allergies indicates no known allergies.  Home Medications   Prior to Admission medications   Medication Sig Start Date End Date Taking? Authorizing Provider  ibuprofen (ADVIL,MOTRIN) 800 MG tablet Take 1 tablet (800 mg total) by mouth 3 (three) times daily. 12/28/14  Yes Ethelda Chick, MD  methocarbamol (ROBAXIN) 500 MG tablet Take 3 tablets (1,500 mg total) by mouth 4 (four) times daily. 01/04/15  Yes Carmelina Dane, MD  oxyCODONE-acetaminophen (PERCOCET) 10-325 MG per tablet Take  1 tablet by mouth every 8 (eight) hours as needed for pain. 01/04/15  Yes Carmelina DaneJeffery S Anderson, MD  fluticasone (FLONASE) 50 MCG/ACT nasal spray Place 2 sprays into both nostrils daily. 03/19/14   Chelle Tessa LernerS Jeffery, PA-C  HYDROcodone-acetaminophen (NORCO) 5-325 MG per tablet Take 1 tablet by mouth every 6 (six) hours as needed for moderate pain. 12/28/14   Ethelda ChickKristi M Smith, MD  ipratropium (ATROVENT) 0.06 % nasal spray Place 2 sprays into the nose 3 (three) times daily. 12/04/13   Raymon Muttonyan M Dunn, PA-C  LORazepam  (ATIVAN) 1 MG tablet Take 1 tablet (1 mg total) by mouth 2 (two) times daily as needed for anxiety or sleep. 12/28/14   Ethelda ChickKristi M Smith, MD  oxybutynin (DITROPAN XL) 15 MG 24 hr tablet Take 1 tablet (15 mg total) by mouth at bedtime. 10/21/14   Chelle S Jeffery, PA-C   BP 121/80 mmHg  Pulse 84  Temp(Src) 98 F (36.7 C) (Oral)  Resp 17  SpO2 100% Physical Exam  Constitutional: She is oriented to person, place, and time. She appears well-developed and well-nourished. No distress.  HENT:  Head: Normocephalic and atraumatic.  Eyes: Conjunctivae and EOM are normal. Right eye exhibits no discharge. Left eye exhibits no discharge.  Neck: Normal range of motion. Neck supple.  Cardiovascular: Normal rate, regular rhythm and normal heart sounds.   Cap refill < 3 seconds Negative swelling or pitting edema noted to the lower extremities bilaterally   Pulmonary/Chest: Effort normal and breath sounds normal. No respiratory distress. She has no wheezes. She has no rales.  Musculoskeletal: She exhibits tenderness. She exhibits no edema.       Lumbar back: She exhibits tenderness (Muscular in nature). She exhibits normal range of motion, no bony tenderness, no swelling, no edema, no deformity, no laceration and no pain.       Back:       Legs: Negative swelling, erythema, inflammation, lesions, sores, deformities identified. Tenderness upon palpation to the musculature of the left lower back and posterior aspect of the left leg from hip to foot. Decreased ROM to the LLE secondary to pain.   Neurological: She is alert and oriented to person, place, and time. No cranial nerve deficit. She exhibits normal muscle tone. Coordination normal.  Cranial nerves III-XII grossly intact Strength 5+/5+ to lower extremities bilaterally with resistance applied, equal distribution noted Negative saddle paresthesias bilaterally  Sensation intact with differentiation to sharp and dull touch  Gait proper with negative ataxia -  mild limp noted when patient applies pressure to the left leg  Skin: Skin is warm and dry. No rash noted. She is not diaphoretic. No erythema.  Psychiatric: She has a normal mood and affect. Her behavior is normal. Thought content normal.  Nursing note and vitals reviewed.   ED Course  Procedures (including critical care time)  Dg Lumbar Spine Complete  01/05/2015   CLINICAL DATA:  Lower back pain x almost 2 weeks. Pain traveling down left hip and left leg to foot since Friday. No injury.  EXAM: LUMBAR SPINE - COMPLETE 4+ VIEW  COMPARISON:  None.  FINDINGS: No fracture. No spondylolisthesis. There is mild straightening of the normal lumbar lordosis.  Moderate loss disc height at L4-L5 with endplate sclerosis and small osteophytes. Remaining disc spaces are relatively well preserved. Facet joints are relatively well preserved.  Soft tissues are unremarkable.  IMPRESSION: No fracture or acute finding.  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   Electronically Signed   By: Onalee Huaavid  Ormond M.D.   On: 01/05/2015 11:42    Labs Review Labs Reviewed - No data to display  Imaging Review Dg Lumbar Spine Complete  01/05/2015   CLINICAL DATA:  Lower back pain x almost 2 weeks. Pain traveling down left hip and left leg to foot since Friday. No injury.  EXAM: LUMBAR SPINE - COMPLETE 4+ VIEW  COMPARISON:  None.  FINDINGS: No fracture. No spondylolisthesis. There is mild straightening of the normal lumbar lordosis.  Moderate loss disc height at L4-L5 with endplate sclerosis and small osteophytes. Remaining disc spaces are relatively well preserved. Facet joints are relatively well preserved.  Soft tissues are unremarkable.  IMPRESSION: No fracture or acute finding.  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   Electronically Signed   By: Amie Portland M.D.   On: 01/05/2015 11:42     EKG Interpretation None      MDM   Final diagnoses:  Back pain  Lumbar degenerative disc disease  Sciatica, left    Medications -  No data to display  Filed Vitals:   01/05/15 0957  BP: 121/80  Pulse: 84  Temp: 98 F (36.7 C)  TempSrc: Oral  Resp: 17  SpO2: 100%    This provider reviewed the patient's chart. Patient was seen and assessed in urgent care Center on 12/28/2014 where patient was diagnosed with sciatic neuritis and started on Vicodin, Robaxin. Patient was then again seen on 01/04/2015 regarding worsening left leg pain. Patient was then started on Percocet 10-325 and robaxin was increased to 1.5 mg every 4 hours.  Plain film of lumbar spine noted degenerative disc disease at L4-L5, moderate loss of disc height at L4-L5 with endplate sclerosis and small osteophytes. Negative focal neurological deficits. Strength intact with equal distribution. Pulses palpable and strong. Negative signs of ischemia. Gait proper with-negative ataxia or step-offs-mild limp identified secondary to pain the left leg. Doubt cauda equina. Doubt epidural abscess. Discussed case with attending physician, Dr. Patria Mane who agrees to plan of care for discharge and follow-up with orthopedics and PCP. Suspicion high for sciatica secondary to lumbar degenerative disc disease. Patient stable, afebrile. Patient not septic appearing. Negative signs of respiratory distress. Discharged patient. Discussed with patient to rest, avoid any physical strenuous activity-no heavy lifting. Discussed with patient to apply heat and massage with icy hot ointment. Referred patient to PCP and orthopedics. Discussed with patient to closely monitor symptoms and if symptoms are to worsen or change to report back to the ED - strict return instructions given.  Patient agreed to plan of care, understood, all questions answered.    Raymon Mutton, PA-C 01/05/15 1200  Azalia Bilis, MD 01/05/15 1540

## 2015-01-05 NOTE — ED Notes (Addendum)
Pt reports severe l/leg pain x 3 days. Pain initiated in low back and has moved to l/leg and radiates to l/foot. Pt was seen yesterday at Urgent Care twice in last 10 days. Tx with Methocarbamol, Ibuprofen,Oxycodone. Decreased low back pain. Leg pain more severe. No radiological studies done

## 2015-01-07 ENCOUNTER — Other Ambulatory Visit: Payer: Self-pay | Admitting: Orthopaedic Surgery

## 2015-01-07 DIAGNOSIS — M545 Low back pain: Secondary | ICD-10-CM

## 2015-01-15 ENCOUNTER — Other Ambulatory Visit: Payer: Self-pay | Admitting: Orthopaedic Surgery

## 2015-01-15 DIAGNOSIS — M545 Low back pain, unspecified: Secondary | ICD-10-CM

## 2015-01-15 DIAGNOSIS — G8929 Other chronic pain: Secondary | ICD-10-CM

## 2015-01-16 ENCOUNTER — Ambulatory Visit
Admission: RE | Admit: 2015-01-16 | Discharge: 2015-01-16 | Disposition: A | Discharge status: Home / Self Care | Payer: BLUE CROSS/BLUE SHIELD | Source: Ambulatory Visit | Attending: Orthopaedic Surgery | Admitting: Orthopaedic Surgery

## 2015-01-16 DIAGNOSIS — M545 Low back pain, unspecified: Secondary | ICD-10-CM

## 2015-01-16 DIAGNOSIS — G8929 Other chronic pain: Secondary | ICD-10-CM

## 2015-01-16 MED ORDER — IOHEXOL 180 MG/ML  SOLN
1.0000 mL | Freq: Once | INTRAMUSCULAR | Status: AC | PRN
  Administered 2015-01-16: 1 mL via EPIDURAL

## 2015-01-16 MED ORDER — METHYLPREDNISOLONE ACETATE 40 MG/ML INJ SUSP (RADIOLOG
120.0000 mg | Freq: Once | INTRAMUSCULAR | Status: AC
  Administered 2015-01-16: 120 mg via EPIDURAL

## 2015-01-16 NOTE — Discharge Instructions (Signed)

## 2015-01-17 ENCOUNTER — Other Ambulatory Visit: Payer: BC Managed Care – PPO

## 2015-02-10 ENCOUNTER — Ambulatory Visit: Payer: BLUE CROSS/BLUE SHIELD | Attending: Orthopaedic Surgery | Admitting: Rehabilitation

## 2015-02-10 ENCOUNTER — Encounter: Payer: Self-pay | Admitting: Rehabilitation

## 2015-02-10 DIAGNOSIS — M545 Low back pain: Secondary | ICD-10-CM | POA: Diagnosis not present

## 2015-02-10 DIAGNOSIS — M5430 Sciatica, unspecified side: Secondary | ICD-10-CM | POA: Insufficient documentation

## 2015-02-10 DIAGNOSIS — R269 Unspecified abnormalities of gait and mobility: Secondary | ICD-10-CM | POA: Diagnosis not present

## 2015-02-10 DIAGNOSIS — M5440 Lumbago with sciatica, unspecified side: Secondary | ICD-10-CM

## 2015-02-10 DIAGNOSIS — M5386 Other specified dorsopathies, lumbar region: Secondary | ICD-10-CM | POA: Insufficient documentation

## 2015-02-10 NOTE — Therapy (Signed)
[facility] [address] [place], [place], [address] Phone: [phone]   Fax:  [phone]  Physical Therapy Evaluation  Patient Details  Name: Rebecca Grant MRN: [id number] Date of Birth: 03/27/1966 Referring Provider:  Kathryne HitchBlackman, Christopher Y*  Encounter Date: 02/10/2015      PT End of Session - 02/10/15 1221    Visit Number 1   Number of Visits 23   Date for PT Re-Evaluation 04/12/15   PT Start Time 1100   PT Stop Time 1145   PT Time Calculation (min) 45 min      Past Medical History  Diagnosis Date  . Anxiety   . Insomnia   . Endometriosis     s/p hysterectomy  . Back ache     Past Surgical History  Procedure Laterality Date  . Abdominal hysterectomy    . Appendectomy      There were no vitals filed for this visit.  Visit Diagnosis:  Lumbago of lumbosacaral region with sciatica - Plan: PT plan of care cert/re-cert  Decreased ROM of intervertebral discs of lumbar spine - Plan: PT plan of care cert/re-cert  Abnormality of gait - Plan: PT plan of care cert/re-cert      Subjective Assessment - 02/10/15 1145    Subjective Pt. to PT for sudden onset acute LBP with BLE radiculopathy 1 month ago for no apparent reason.  Has been on pain meds, prednisone, steroid injection with some relief.     Pertinent History Symptoms have localized to L low back and LLE primarily along sciatic distribution and are constant, though not as severe.   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 but is now back at work.    States pain is bearable in AM, but severe by end of day.    She does have a history of HNP at L5-S1 5 yrs ago, but has had no pain in 5 yrs until now.   Patient Stated Goals return to all activities without pain interference   Currently in Pain? Yes   Pain Score 4    Pain Location Back   Pain Orientation Left   Pain Descriptors / Indicators  Aching;Constant;Shooting;Radiating;Tingling   Pain Type Acute pain   Pain Radiating Towards L foot to under great toe   Pain Onset 1 to 4 weeks ago   Pain Frequency Constant   Aggravating Factors  sit to stand, on feet all day   Pain Relieving Factors lying on R side   Effect of Pain on Daily Activities all ADL and work is severely limited by pain            [facility] PT Assessment - 02/10/15 0001    Assessment   Medical Diagnosis 724.2   Next MD Visit 3 weeks   Prior Therapy no   Balance Screen   Has the patient fallen in the past 6 months No   Has the patient had a decrease in activity level because of a fear of falling?  Yes   Prior Function   Level of Independence Independent with basic ADLs   Vocation Full time employment   ROM / Strength   AROM / PROM / Strength AROM   AROM   Overall AROM Comments Lumbar:  50% flexion, 10% extension, 75% bilat sidebending, 50% bilat rotation   Strength   Overall Strength Comments LLE:  4+ hip flex, 4/5 knee  ext, 3-/5 ankle and great toe dorsiflex,   can toe walk, but not heel walk   Palpation   Palpation L low back and along sciatic distribution into hip   Special Tests    Special Tests Lumbar   Slump test   Findings Positive   Side Left   Straight Leg Raise   Findings Positive   Side  Left                   [facility] Adult PT Treatment/Exercise - 02/10/15 0001    Ambulation/Gait   Ambulation/Gait Yes   Ambulation/Gait Assistance 7: Independent   Assistive device None   Gait Pattern Antalgic   Exercises   Exercises --  standing lumbar extension 10x/hourly to tolerance   Electrical Stimulation   Electrical Stimulation Location lumbar   Electrical Stimulation Action IFC at machine default settings   Electrical Stimulation Parameters intesity to tolerance   Electrical Stimulation Goals Pain                PT Education - 02/10/15 1219    Education provided Yes   Education Details standing lumbar extension  every hour 10x to tolerance;   d/c if pain or radicular symptoms are increased   Person(s) Educated Patient   Methods Demonstration   Comprehension Returned demonstration          PT Short Term Goals - 02/10/15 1225    PT SHORT TERM GOAL #1   Title Patient will report 4/10 low back and sciatica at worst to allow for her to work with less pain   Baseline 0/10 pain   Time 4   Period Weeks   Status New   PT SHORT TERM GOAL #2   Title Patient will demonstrate 75% lumbar ROM in all planes to allow for reaching low shelving for ADL and work   Baseline 100% painfree ROM   Time 4   Period Weeks   Status New   PT SHORT TERM GOAL #3   Title Patient will demonstrate 5/5 LLE strength to allow for independent squatting, stairs, and gait   Baseline 5/5 strength BLE   Time 4   Period Weeks           PT Long Term Goals - 02/10/15 1227    PT LONG TERM GOAL #1   Title Patient will report 4/10 low back pain at worst and only intermittently, and no sciatica   Baseline no sciatica or LBP   Time 8   Period Weeks   Status New   PT LONG TERM GOAL #2   Title Patient will demo 100% unrestricted lumbar ROM   Baseline 100% lumbar ROM   Time 4   Period Weeks   Status New   PT LONG TERM GOAL #3   Title Patient will ambulate without any limp or pain in the LLE for return to unrestricted work   Baseline independent, normal gait   Time 8   Period Weeks   Status New               Plan - 02/10/15 1230    Clinical Impression Statement Pt. has significant LBP with posterior radicular sx to great toe as well as true neuro weakness in the LLE impairing all ADL and work activities   Pt will benefit from skilled therapeutic intervention in order to improve on the following deficits Abnormal gait;Decreased range of motion;Impaired sensation;Decreased strength;Impaired flexibility   Rehab Potential Good   PT Frequency  2x / week   PT Duration 8 weeks  1 week   PT Treatment/Interventions  Moist Heat;Therapeutic activities;Patient/family education;Traction;Therapeutic exercise;Manual techniques   PT Next Visit Plan see if pt. saw any improvement with standing lumbar extension;   if no worse try to progress with prone activity but monitor radicular sx closely.     If worse, may need to do flexion ex   PT Home Exercise Plan standing lumbar ext to tolerance;    reassess   Consulted and Agree with Plan of Care Patient         Problem List Patient Active Problem List   Diagnosis Date Noted  . Nocturia 03/19/2014  . Obesity (BMI 30-39.9) 03/19/2014  . Anxiety 03/08/2013  . Insomnia     Vander Kueker, PT 02/10/2015, 12:40 PM  [facility] [address] [place], [place], [address] Phone: [phone]   Fax:  [phone]

## 2015-02-14 ENCOUNTER — Encounter: Payer: Self-pay | Admitting: Physical Therapy

## 2015-02-14 ENCOUNTER — Ambulatory Visit: Payer: BLUE CROSS/BLUE SHIELD | Admitting: Physical Therapy

## 2015-02-14 DIAGNOSIS — M5440 Lumbago with sciatica, unspecified side: Secondary | ICD-10-CM

## 2015-02-14 DIAGNOSIS — M545 Low back pain: Secondary | ICD-10-CM | POA: Diagnosis not present

## 2015-02-14 DIAGNOSIS — M5386 Other specified dorsopathies, lumbar region: Secondary | ICD-10-CM

## 2015-02-14 NOTE — Therapy (Signed)
[facility] [address] [place], [place], [address] Phone: [phone]   Fax:  [phone]  Physical Therapy Treatment  Patient Details  Name: Rebecca Grant MRN: [id number] Date of Birth: Jun 07, 1966 Referring Provider:  Kathryne Hitch*  Encounter Date: 02/14/2015      PT End of Session - 02/14/15 0922    Visit Number 2   Date for PT Re-Evaluation 04/12/15   PT Start Time 0837   PT Stop Time 0943   PT Time Calculation (min) 66 min      Past Medical History  Diagnosis Date  . Anxiety   . Insomnia   . Endometriosis     s/p hysterectomy  . Back ache     Past Surgical History  Procedure Laterality Date  . Abdominal hysterectomy    . Appendectomy      There were no vitals filed for this visit.  Visit Diagnosis:  Lumbago of lumbosacaral region with sciatica  Decreased ROM of intervertebral discs of lumbar spine      Subjective Assessment - 02/14/15 0845    Subjective Patient reports that when she woke up yesterday that she had significant pain and soreness.  Reports trying exercises, helps a little   Currently in Pain? Yes   Pain Score 4    Pain Location Back   Pain Orientation Left   Pain Descriptors / Indicators Aching;Constant;Radiating                         [facility] Adult PT Treatment/Exercise - 02/14/15 0001    Lumbar Exercises: Aerobic   Tread Mill NuStep L4 x 6 minutes   Lumbar Exercises: Machines for Strengthening   Other Lumbar Machine Exercise seated row and lat 20#   Lumbar Exercises: Seated   Hip Flexion on Ball 20 reps   Hip Flexion on Ball Limitations worked on pelvic mobility and stability while sitting on the ball   Lumbar Exercises: Supine   Ab Set 20 reps   Dead Bug 20 reps   Dead Bug Limitations cues verbal and tactile needd to hold pelvis stable   Straight Leg Raise 20 reps   Large Ball Abdominal Isometric 20 reps   Electrical Stimulation   Electrical Stimulation Location lumbar   Electrical Stimulation Parameters IFC   Electrical Stimulation Goals Pain   Manual Therapy   Manual Therapy Manual Traction   Manual Traction use of sheet hold 1 minute x 6                  PT Short Term Goals - 02/10/15 1225    PT SHORT TERM GOAL #1   Title Patient will report 4/10 low back and sciatica at worst to allow for her to work with less pain   Baseline 0/10 pain   Time 4   Period Weeks   Status New   PT SHORT TERM GOAL #2   Title Patient will demonstrate 75% lumbar ROM in all planes to allow for reaching low shelving for ADL and work   Baseline 100% painfree ROM   Time 4   Period Weeks   Status New   PT SHORT TERM GOAL #3   Title Patient will demonstrate 5/5 LLE strength to allow for independent squatting, stairs, and gait   Baseline 5/5 strength BLE   Time 4   Period Weeks           PT Long Term  Goals - 02/10/15 1227    PT LONG TERM GOAL #1   Title Patient will report 4/10 low back pain at worst and only intermittently, and no sciatica   Baseline no sciatica or LBP   Time 8   Period Weeks   Status New   PT LONG TERM GOAL #2   Title Patient will demo 100% unrestricted lumbar ROM   Baseline 100% lumbar ROM   Time 4   Period Weeks   Status New   PT LONG TERM GOAL #3   Title Patient will ambulate without any limp or pain in the LLE for return to unrestricted work   Baseline independent, normal gait   Time 8   Period Weeks   Status New               Plan - 02/14/15 [id number]    Clinical Impression Statement Has lumbar pain, some pain into the left buttock with activity   PT Home Exercise Plan see if we can add exercises, may need body mechanics instruction   Consulted and Agree with Plan of Care Patient        Problem List Patient Active Problem List   Diagnosis Date Noted  . Nocturia 03/19/2014  . Obesity (BMI 30-39.9) 03/19/2014  . Anxiety 03/08/2013  . Insomnia     Jearld LeschALBRIGHT,Avanthika Dehnert  W, PT 02/14/2015, 9:27 AM  [facility] [address] [place], [place], [address] Phone: [phone]   Fax:  [phone]

## 2015-02-17 ENCOUNTER — Ambulatory Visit: Payer: BLUE CROSS/BLUE SHIELD

## 2015-02-19 ENCOUNTER — Ambulatory Visit: Payer: BLUE CROSS/BLUE SHIELD | Attending: Orthopaedic Surgery | Admitting: Rehabilitation

## 2015-02-19 DIAGNOSIS — M5386 Other specified dorsopathies, lumbar region: Secondary | ICD-10-CM | POA: Diagnosis not present

## 2015-02-19 DIAGNOSIS — M545 Low back pain: Secondary | ICD-10-CM | POA: Diagnosis present

## 2015-02-19 DIAGNOSIS — M5430 Sciatica, unspecified side: Secondary | ICD-10-CM | POA: Diagnosis not present

## 2015-02-19 DIAGNOSIS — R269 Unspecified abnormalities of gait and mobility: Secondary | ICD-10-CM | POA: Insufficient documentation

## 2015-02-19 DIAGNOSIS — M5440 Lumbago with sciatica, unspecified side: Secondary | ICD-10-CM

## 2015-02-19 NOTE — Therapy (Addendum)
[address] [place], [place], [address] Phone: [phone]   Fax:  [phone]  Physical Therapy Treatment  Patient Details  Name: Rebecca Grant MRN: [id number] Date of Birth: 1965-10-21 Referring Provider:  Mcarthur Rossetti*  Encounter Date: 02/19/2015    Past Medical History  Diagnosis Date  . Anxiety   . Insomnia   . Endometriosis     s/p hysterectomy  . Back ache     Past Surgical History  Procedure Laterality Date  . Abdominal hysterectomy    . Appendectomy      There were no vitals filed for this visit.  Visit Diagnosis:  Lumbago of lumbosacaral region with sciatica  Decreased ROM of intervertebral discs of lumbar spine  Abnormality of gait                                 PT Short Term Goals - 02/10/15 1225    PT SHORT TERM GOAL #1   Title Patient will report 4/10 low back and sciatica at worst to allow for her to work with less pain   Baseline 0/10 pain   Time 4   Period Weeks   Status New   PT SHORT TERM GOAL #2   Title Patient will demonstrate 75% lumbar ROM in all planes to allow for reaching low shelving for ADL and work   Baseline 100% painfree ROM   Time 4   Period Weeks   Status New   PT SHORT TERM GOAL #3   Title Patient will demonstrate 5/5 LLE strength to allow for independent squatting, stairs, and gait   Baseline 5/5 strength BLE   Time 4   Period Weeks           PT Long Term Goals - 02/10/15 1227    PT LONG TERM GOAL #1   Title Patient will report 4/10 low back pain at worst and only intermittently, and no sciatica   Baseline no sciatica or LBP   Time 8   Period Weeks   Status New   PT LONG TERM GOAL #2   Title Patient will demo 100% unrestricted lumbar ROM   Baseline 100% lumbar ROM   Time 4   Period Weeks   Status New   PT LONG TERM GOAL #3   Title Patient will ambulate without any limp or pain in the LLE for  return to unrestricted work   Baseline independent, normal gait   Time 8   Period Weeks   Status New            PHYSICAL THERAPY DISCHARGE SUMMARY  Visits from Start of Care: 3   Plan: Patient agrees to discharge.  Patient goals were met. Patient is being discharged due to meeting the stated rehab goals.  ?????        Problem List Patient Active Problem List   Diagnosis Date Noted  . Nocturia 03/19/2014  . Obesity (BMI 30-39.9) 03/19/2014  . Anxiety 03/08/2013  . Insomnia     Madelyn Flavors PT  05/28/2015, 9:59 AM  [address] [place], [place], [address] Phone: [phone]   Fax:  [phone]

## 2015-02-19 NOTE — Patient Instructions (Signed)
Hip Flexion / Knee Extension: Straight-Leg Raise (Eccentric)   Lie on back. Lift leg with knee straight. Slowly lower leg for 3-5 seconds keeping abdominals tight. _10__ reps per set, _1-3__ sets per day,  ABDUCTION: Side-Lying (Active)   Lie on left side, top leg straight. Raise top leg as far as possible.  Complete _1-3__ sets of _10__ repetitions. Perform ___ sessions per day.  http://gtsc.exer.us/94   (Home) Extension: Hip   With support under abdomen, tighten stomach. Lift right leg in line with body. Do not hyperextend. Alternate legs. Repeat _10___ times per set. Do _1-3___ sets per session. Do ____ sessions per week.   Isometric Abdominal   Lying on back with knees bent, tighten stomach. Hold _5___ seconds. Repeat ___10_ times per set. Do _2___ sets per session. Do _1___ sessions per day.  http://orth.exer.us/1086   Copyright  VHI. All rights reserved.  Solon PalmJulie Dody Smartt, PT 02/19/2015 9:32 AM

## 2015-03-13 ENCOUNTER — Telehealth: Payer: Self-pay

## 2015-03-13 DIAGNOSIS — G4700 Insomnia, unspecified: Secondary | ICD-10-CM

## 2015-03-13 NOTE — Telephone Encounter (Signed)
REQUESTING REFILL LORazepam (ATIVAN) 1 MG tablet [[id number]][[id number]]

## 2015-03-15 ENCOUNTER — Other Ambulatory Visit: Payer: Self-pay | Admitting: Family Medicine

## 2015-03-18 MED ORDER — LORAZEPAM 1 MG PO TABS: 1.0000 mg | ORAL_TABLET | Freq: Two times a day (BID) | ORAL | Status: DC | PRN

## 2015-03-18 NOTE — Telephone Encounter (Signed)
Printed at 104. I will bring it to 102 after clinic.  Meds ordered this encounter  Medications  . LORazepam (ATIVAN) 1 MG tablet    Sig: Take 1 tablet (1 mg total) by mouth 2 (two) times daily as needed for anxiety or sleep.    Dispense:  60 tablet    Refill:  0    Order Specific Question:  Supervising Provider    Answer:  DOOLITTLE, ROBERT P [[id number]]

## 2015-03-18 NOTE — Telephone Encounter (Signed)
LMOM that RF was faxed in.

## 2015-04-25 ENCOUNTER — Other Ambulatory Visit: Payer: Self-pay | Admitting: Emergency Medicine

## 2015-04-29 NOTE — Telephone Encounter (Signed)
Faxed

## 2015-04-29 NOTE — Telephone Encounter (Signed)
Meds ordered this encounter  Medications  . LORazepam (ATIVAN) 1 MG tablet    Sig: TAKE 1 TABLET BY MOUTH TWICE DAILY AS NEEDED FOR ANXIETY OR SLEEP    Dispense:  60 tablet    Refill:  0   Printed at 104. Will bring to 102 after clinic.

## 2015-06-10 ENCOUNTER — Other Ambulatory Visit: Payer: Self-pay | Admitting: Physician Assistant

## 2015-06-11 NOTE — Telephone Encounter (Signed)
Pt called back and req'd Rx to be faxed. Done.

## 2015-06-11 NOTE — Telephone Encounter (Signed)
Rx printed.  Meds ordered this encounter  Medications  . LORazepam (ATIVAN) 1 MG tablet    Sig: TAKE 1 TABLET BY MOUTH TWICE DAILY AS NEEDED FOR ANXIETY OR SLEEP    Dispense:  60 tablet    Refill:  0

## 2015-06-11 NOTE — Telephone Encounter (Signed)
Called patient.  Script in drawer and patient aware.

## 2015-07-17 ENCOUNTER — Other Ambulatory Visit: Payer: Self-pay | Admitting: Physician Assistant

## 2015-07-17 DIAGNOSIS — L309 Dermatitis, unspecified: Secondary | ICD-10-CM

## 2015-07-18 NOTE — Telephone Encounter (Signed)
According to pt email 02/21/14, you Rx this for pt for a rash she gets during the summer. Do you want to RF or RTC?

## 2015-07-18 NOTE — Telephone Encounter (Signed)
Meds ordered this encounter  Medications  . clobetasol cream (TEMOVATE) 0.05 %    Sig: APPLY TOPICALLY TWICE DAILY AS NEEDED    Dispense:  30 g    Refill:  0

## 2015-07-24 ENCOUNTER — Other Ambulatory Visit: Payer: Self-pay | Admitting: Physician Assistant

## 2015-07-25 NOTE — Telephone Encounter (Signed)
Faxed

## 2015-08-26 ENCOUNTER — Other Ambulatory Visit: Payer: Self-pay | Admitting: Physician Assistant

## 2015-10-04 ENCOUNTER — Other Ambulatory Visit: Payer: Self-pay | Admitting: Physician Assistant

## 2015-10-06 NOTE — Telephone Encounter (Signed)
Faxed

## 2015-10-06 NOTE — Telephone Encounter (Signed)
Meds ordered this encounter  Medications  . LORazepam (ATIVAN) 1 MG tablet    Sig: TAKE 1 TABLET BY MOUTH TWICE DAILY AS NEEDED FOR ANXIETY OR SLEEP    Dispense:  60 tablet    Refill:  0

## 2015-11-24 ENCOUNTER — Other Ambulatory Visit: Payer: Self-pay | Admitting: Physician Assistant

## 2015-11-25 NOTE — Telephone Encounter (Signed)
Rx printed at 104. Will bring to 102 after clinic. Please advise patient she needs OV for more.  Meds ordered this encounter  Medications  . LORazepam (ATIVAN) 1 MG tablet    Sig: TAKE 1 TABLET BY MOUTH TWICE DAILY AS NEEDED FOR ANXIETY OR SLEEP    Dispense:  60 tablet    Refill:  0    Please advise patient she needs office visit for more.

## 2015-11-26 NOTE — Telephone Encounter (Signed)
Faxed and notified pt of need for f/up. Scheduled appt, first available, 4/11.

## 2015-12-10 IMAGING — CR DG LUMBAR SPINE COMPLETE 4+V
5 series · 5 of 5 positions shown · non-contrast
Comparison: None.

CLINICAL DATA: Lower back pain x almost 2 weeks. Pain traveling
down left hip and left leg to foot since [REDACTED]. No injury.

EXAM:
LUMBAR SPINE - COMPLETE 4+ VIEW

[t lumbar spine ap]
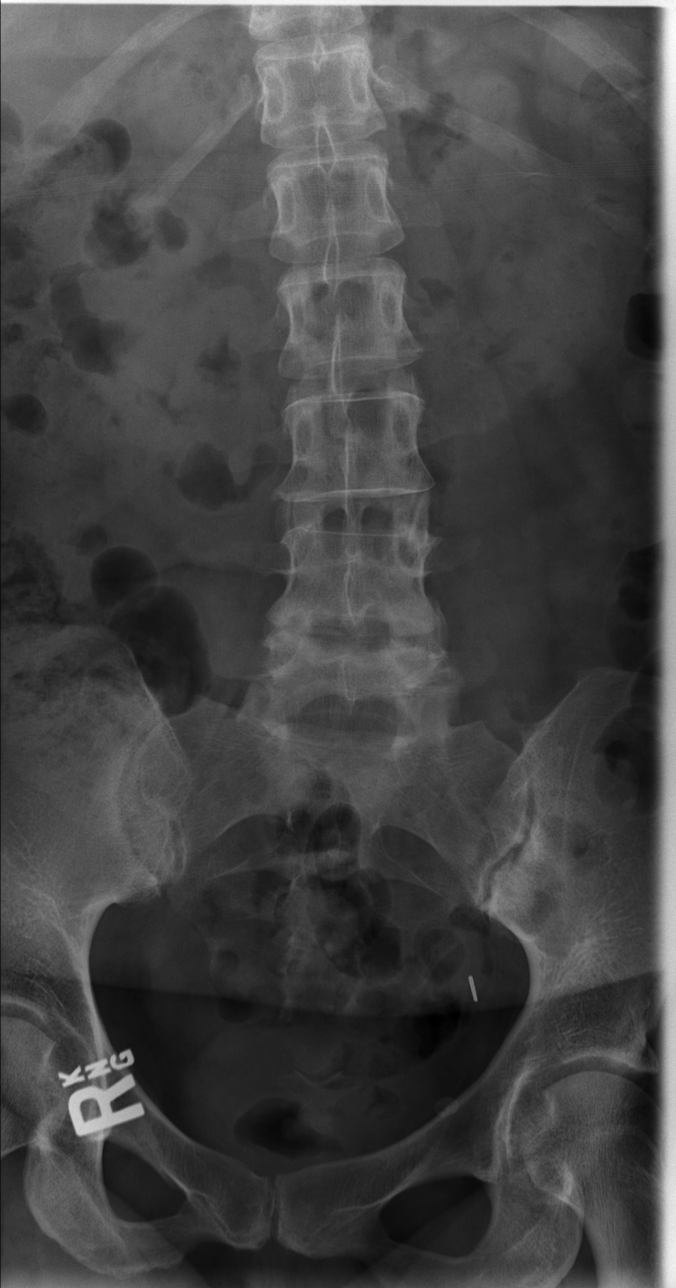

[t lumbar spine obl (1 of 2)]
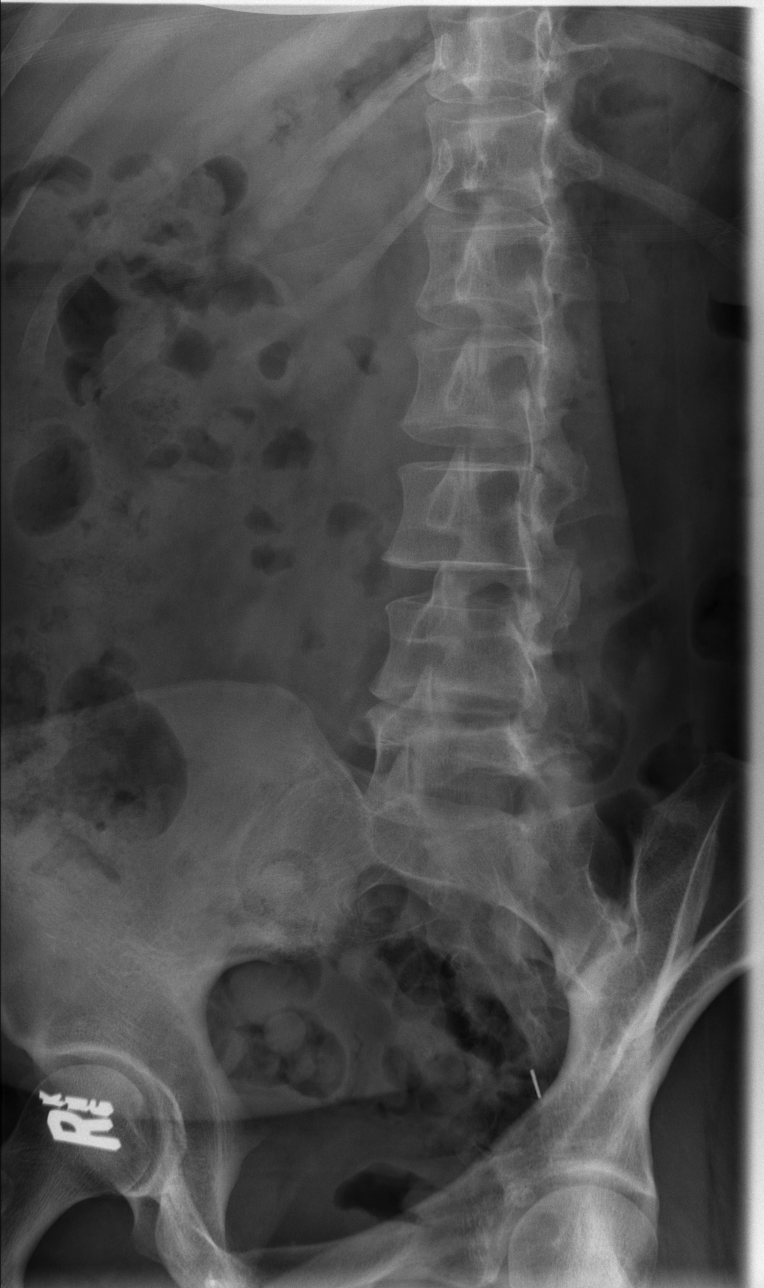

[t lumbar spine obl (2 of 2)]
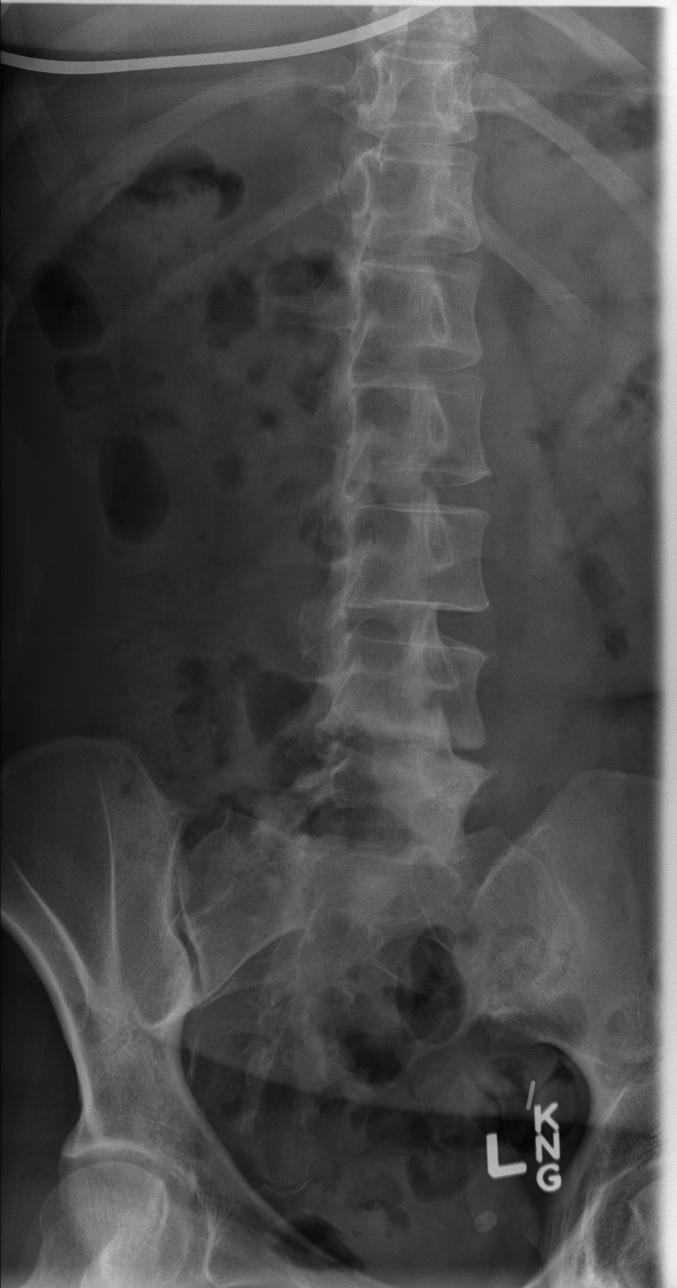

[t lumbar spine lat]
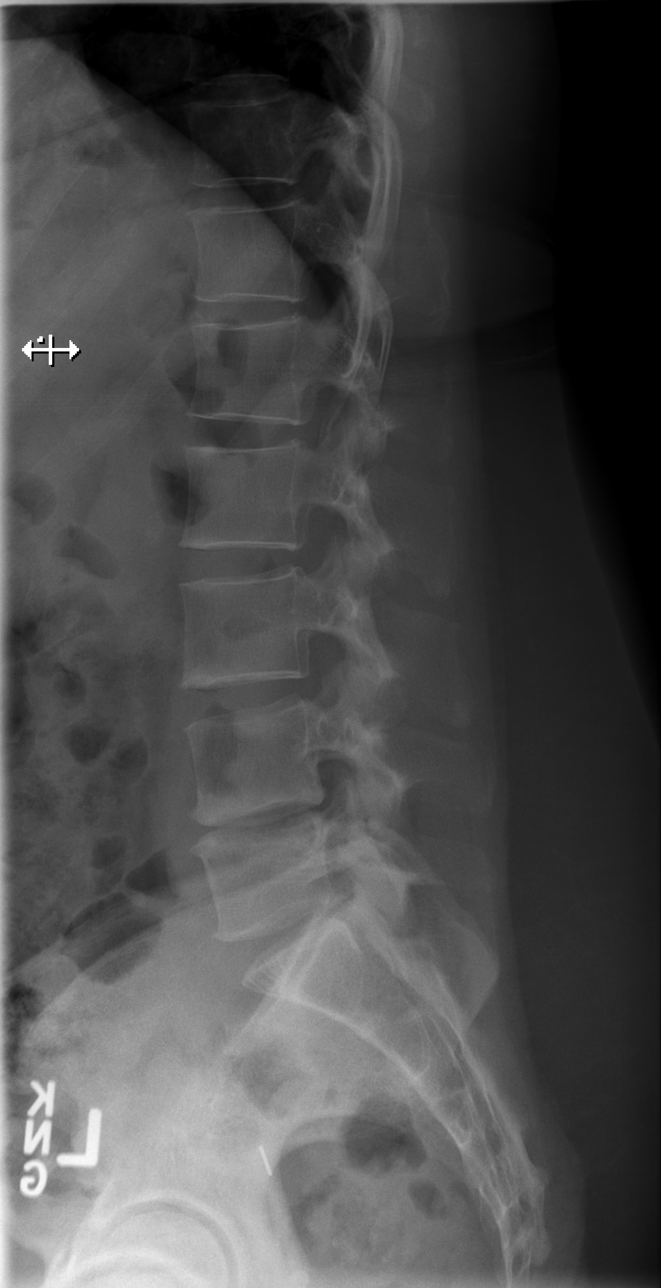

[t lumbar l-5 s-1 spot]
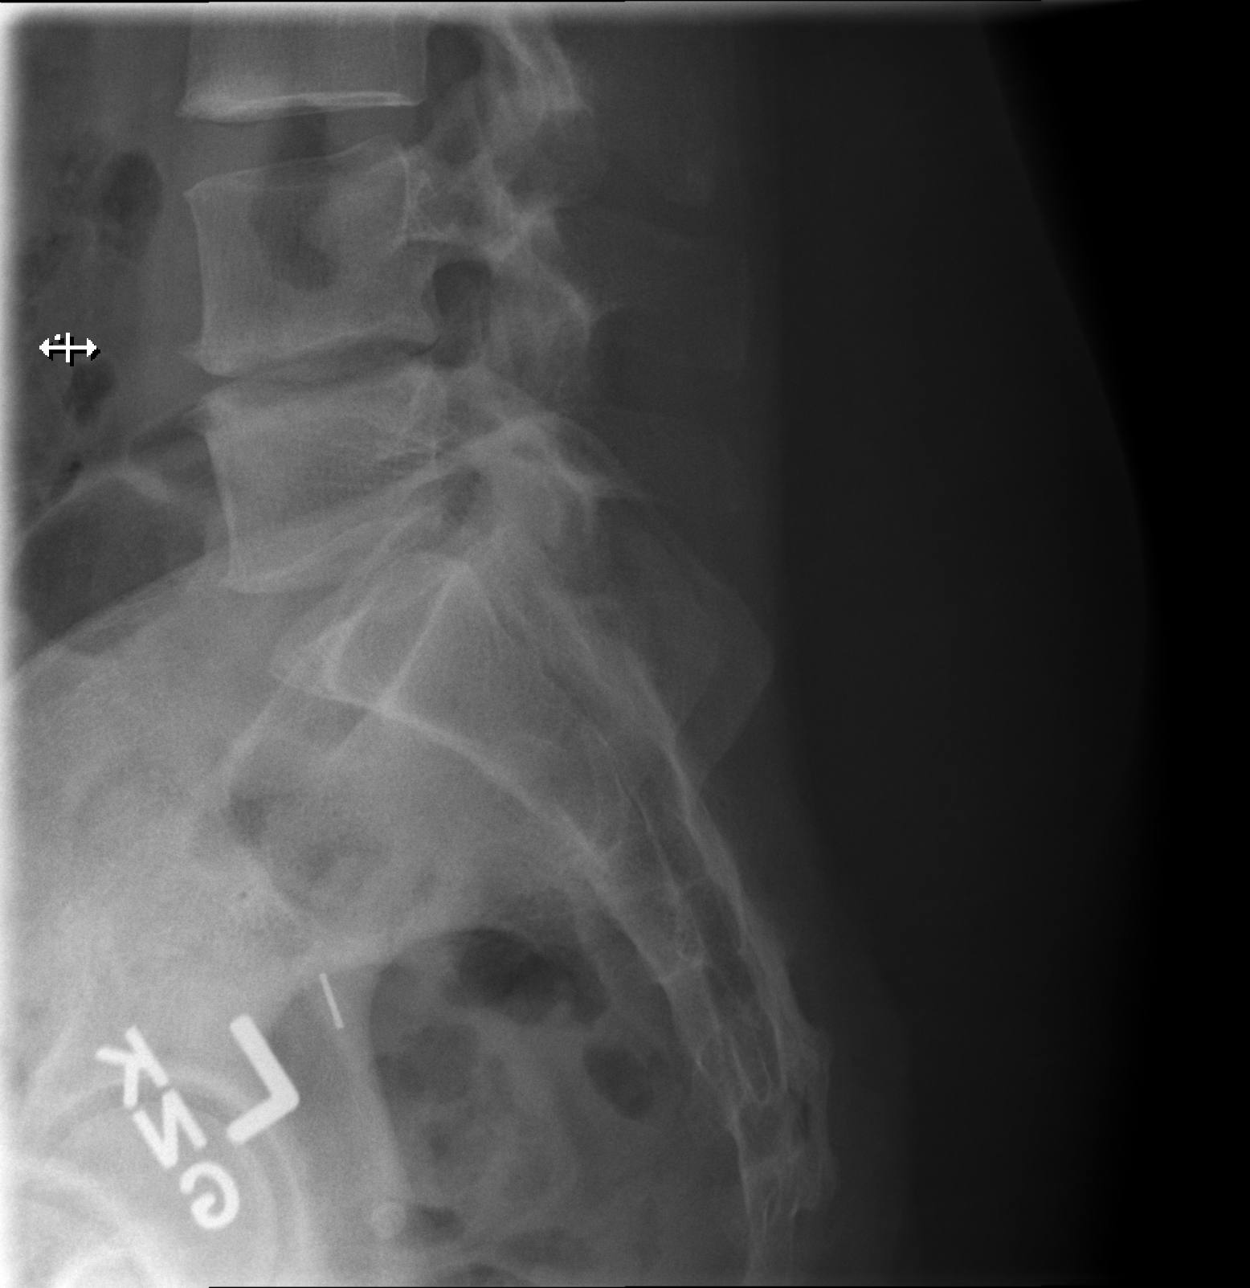

[5 of 5 positions shown; findings below may reference images not displayed]

FINDINGS: No fracture. No spondylolisthesis. There is mild straightening of
the normal lumbar lordosis.

Moderate loss disc height at L4-L5 with endplate sclerosis and small
osteophytes. Remaining disc spaces are relatively well preserved.
Facet joints are relatively well preserved.

Soft tissues are unremarkable.
IMPRESSION: No fracture or acute finding.

Disc degenerative changes at L4-L5.

## 2016-01-27 ENCOUNTER — Ambulatory Visit: Payer: Self-pay | Admitting: Physician Assistant

## 2016-02-25 DIAGNOSIS — H6090 Unspecified otitis externa, unspecified ear: Secondary | ICD-10-CM | POA: Diagnosis not present

## 2016-02-25 DIAGNOSIS — H9209 Otalgia, unspecified ear: Secondary | ICD-10-CM | POA: Diagnosis not present

## 2016-06-03 DIAGNOSIS — Z90710 Acquired absence of both cervix and uterus: Secondary | ICD-10-CM | POA: Diagnosis not present

## 2016-06-03 DIAGNOSIS — M5430 Sciatica, unspecified side: Secondary | ICD-10-CM | POA: Insufficient documentation

## 2016-06-03 DIAGNOSIS — M5431 Sciatica, right side: Secondary | ICD-10-CM | POA: Diagnosis not present

## 2016-06-03 DIAGNOSIS — E669 Obesity, unspecified: Secondary | ICD-10-CM | POA: Diagnosis not present

## 2016-06-03 DIAGNOSIS — N809 Endometriosis, unspecified: Secondary | ICD-10-CM | POA: Diagnosis not present

## 2017-07-11 ENCOUNTER — Encounter (HOSPITAL_COMMUNITY): Payer: Self-pay

## 2017-07-11 ENCOUNTER — Emergency Department (HOSPITAL_COMMUNITY)
Admission: EM | Admit: 2017-07-11 | Discharge: 2017-07-12 | Disposition: A | Discharge status: Home / Self Care | Payer: BLUE CROSS/BLUE SHIELD | Attending: Emergency Medicine | Admitting: Emergency Medicine

## 2017-07-11 ENCOUNTER — Emergency Department (HOSPITAL_COMMUNITY): Payer: BLUE CROSS/BLUE SHIELD

## 2017-07-11 DIAGNOSIS — F17210 Nicotine dependence, cigarettes, uncomplicated: Secondary | ICD-10-CM | POA: Diagnosis not present

## 2017-07-11 DIAGNOSIS — J40 Bronchitis, not specified as acute or chronic: Secondary | ICD-10-CM | POA: Insufficient documentation

## 2017-07-11 DIAGNOSIS — Z79899 Other long term (current) drug therapy: Secondary | ICD-10-CM | POA: Insufficient documentation

## 2017-07-11 DIAGNOSIS — F17200 Nicotine dependence, unspecified, uncomplicated: Secondary | ICD-10-CM | POA: Diagnosis not present

## 2017-07-11 DIAGNOSIS — R05 Cough: Secondary | ICD-10-CM | POA: Diagnosis not present

## 2017-07-11 DIAGNOSIS — R079 Chest pain, unspecified: Secondary | ICD-10-CM | POA: Diagnosis not present

## 2017-07-11 DIAGNOSIS — R059 Cough, unspecified: Secondary | ICD-10-CM

## 2017-07-11 LAB — BASIC METABOLIC PANEL
Anion gap: 7 (ref 5–15)
BUN: 10 mg/dL (ref 6–20)
CO2: 22 mmol/L (ref 22–32)
Calcium: 8.9 mg/dL (ref 8.9–10.3)
Chloride: 111 mmol/L (ref 101–111)
Creatinine, Ser: 0.8 mg/dL (ref 0.44–1.00)
GFR calc Af Amer: >60 mL/min (ref >60)
GFR calc non Af Amer: >60 mL/min (ref >60)
GLUCOSE: 152 mg/dL — AB (ref 65–99)
POTASSIUM: 3.7 mmol/L (ref 3.5–5.1)
Sodium: 140 mmol/L (ref 135–145)

## 2017-07-11 LAB — I-STAT TROPONIN, ED: Troponin i, poc: 0 ng/mL (ref 0.00–0.08)

## 2017-07-11 LAB — CBC
HEMATOCRIT: 45.2 % (ref 36.0–46.0)
Hemoglobin: 15.2 g/dL — ABNORMAL HIGH (ref 12.0–15.0)
MCH: 32.3 pg (ref 26.0–34.0)
MCHC: 33.6 g/dL (ref 30.0–36.0)
MCV: 96.2 fL (ref 78.0–100.0)
Platelets: 221 10*3/uL (ref 150–400)
RBC: 4.7 MIL/uL (ref 3.87–5.11)
RDW: 13.3 % (ref 11.5–15.5)
WBC: 9.1 10*3/uL (ref 4.0–10.5)

## 2017-07-11 MED ORDER — ALBUTEROL SULFATE (2.5 MG/3ML) 0.083% IN NEBU
5.0000 mg | INHALATION_SOLUTION | Freq: Once | RESPIRATORY_TRACT | Status: AC
  Administered 2017-07-11: 5 mg via RESPIRATORY_TRACT

## 2017-07-11 MED ORDER — ALBUTEROL SULFATE (2.5 MG/3ML) 0.083% IN NEBU
INHALATION_SOLUTION | RESPIRATORY_TRACT | Status: AC
  Filled 2017-07-11: qty 3

## 2017-07-11 MED ORDER — HYDROCODONE-ACETAMINOPHEN 5-325 MG PO TABS
2.0000 | ORAL_TABLET | Freq: Once | ORAL | Status: AC
  Administered 2017-07-11: 2 via ORAL
  Filled 2017-07-11: qty 2

## 2017-07-11 MED ORDER — AZITHROMYCIN 250 MG PO TABS
500.0000 mg | ORAL_TABLET | Freq: Once | ORAL | Status: AC
  Administered 2017-07-11: 500 mg via ORAL
  Filled 2017-07-11: qty 2

## 2017-07-11 MED ORDER — BENZONATATE 100 MG PO CAPS
200.0000 mg | ORAL_CAPSULE | Freq: Once | ORAL | Status: AC
  Administered 2017-07-11: 200 mg via ORAL
  Filled 2017-07-11: qty 2

## 2017-07-11 NOTE — ED Notes (Signed)
Patient ambulatory to the room, no complaints at this time.  Getting dressed and will hooked up to telemetry.

## 2017-07-11 NOTE — ED Triage Notes (Signed)
Pt reports cough, sob, and left sided chest pain that began last night. Denies fevers/ chills/ emesis. Denies hx of asthma

## 2017-07-11 NOTE — ED Notes (Signed)
unable to locate patient to move to D34.

## 2017-07-11 NOTE — ED Provider Notes (Signed)
[facility]-EMERGENCY DEPT Provider Note   CSN: [id number] Arrival date & time: 07/11/17  1603     History   Chief Complaint Chief Complaint  Patient presents with  . Chest Pain  . Cough  . Shortness of Breath    HPI Rebecca Grant is a 51 y.o. female.  Emergency department with chief complaint of cough. She states that she had a cough that started 2 days ago. It has progressively worsened. She reports subjective fevers. She states that now she has some pain in her left ribs. She denies any productive cough. He has tried OTC medications with no relief. She denies any known sick contacts. There are no modifying factors.   The history is provided by the patient. No language interpreter was used.    Past Medical History:  Diagnosis Date  . Anxiety   . Back ache   . Endometriosis    s/p hysterectomy  . Insomnia     Patient Active Problem List   Diagnosis Date Noted  . Nocturia 03/19/2014  . Obesity (BMI 30-39.9) 03/19/2014  . Anxiety 03/08/2013  . Insomnia     Past Surgical History:  Procedure Laterality Date  . ABDOMINAL HYSTERECTOMY    . APPENDECTOMY      OB History    No data available       Home Medications    Prior to Admission medications   Medication Sig Start Date End Date Taking? Authorizing Provider  clobetasol cream (TEMOVATE) 0.05 % APPLY TOPICALLY TWICE DAILY AS NEEDED 07/18/15   Leotis Shames, Chelle, PA-C  fluticasone (FLONASE) 50 MCG/ACT nasal spray Place 2 sprays into both nostrils daily. 03/19/14   Porfirio Oar, PA-C  HYDROcodone-acetaminophen (NORCO) 5-325 MG per tablet Take 1 tablet by mouth every 6 (six) hours as needed for moderate pain. 12/28/14   Ethelda Chick, MD  ipratropium (ATROVENT) 0.06 % nasal spray Place 2 sprays into the nose 3 (three) times daily. 12/04/13   Dunn, Raymon Mutton, PA-C  LORazepam (ATIVAN) 1 MG tablet TAKE 1 TABLET BY MOUTH TWICE DAILY AS NEEDED FOR ANXIETY OR SLEEP 11/25/15   Porfirio Oar, PA-C  oxybutynin (DITROPAN XL) 15 MG 24  hr tablet Take 1 tablet (15 mg total) by mouth at bedtime. 10/21/14   Porfirio Oar, PA-C    Family History Family History  Problem Relation Age of Onset  . Hypertension Mother   . Gout Maternal Grandmother   . Heart disease Maternal Grandfather   . Hypertension Maternal Grandfather   . Gout Maternal Grandfather   . Seizures Sister 37       undetermined etiology    Social History Social History  Substance Use Topics  . Smoking status: Current Every Day Smoker    Years: 6.00  . Smokeless tobacco: Never Used     Comment: smokes for stress relief, down to 1 cigarette daily  . Alcohol use No     Allergies   Patient has no known allergies.   Review of Systems Review of Systems  All other systems reviewed and are negative.    Physical Exam Updated Vital Signs BP (!) 154/87   Pulse (!) 107   Temp 98.7 F (37.1 C) (Oral)   Resp 18   Ht  (1.702 m)   Wt 90.7 kg (200 lb)   SpO2 99%   BMI 31.32 kg/m   Physical Exam  Constitutional: She is oriented to person, place, and time. She appears well-developed and well-nourished.  HENT:  Head: Normocephalic and  atraumatic.  Eyes: Pupils are equal, round, and reactive to light. Conjunctivae and EOM are normal.  Neck: Normal range of motion. Neck supple.  Cardiovascular: Normal rate and regular rhythm.  Exam reveals no gallop and no friction rub.   No murmur heard. Pulmonary/Chest: Effort normal and breath sounds normal. No respiratory distress. She has no wheezes. She has no rales. She exhibits no tenderness.  Abdominal: Soft. Bowel sounds are normal. She exhibits no distension and no mass. There is no tenderness. There is no rebound and no guarding.  Musculoskeletal: Normal range of motion. She exhibits no edema or tenderness.  Neurological: She is alert and oriented to person, place, and time.  Skin: Skin is warm and dry.  Psychiatric: She has a normal mood and affect. Her behavior is normal. Judgment and thought  content normal.  Nursing note and vitals reviewed.    ED Treatments / Results  Labs (all labs ordered are listed, but only abnormal results are displayed) Labs Reviewed  BASIC METABOLIC PANEL - Abnormal; Notable for the following:       Result Value   Glucose, Bld 152 (*)    All other components within normal limits  CBC - Abnormal; Notable for the following:    Hemoglobin 15.2 (*)    All other components within normal limits  I-STAT TROPONIN, ED    EKG  EKG Interpretation None       Radiology Dg Chest 2 View  Result Date: 07/11/2017 CLINICAL DATA:  Productive cough, LEFT chest pain, shortness of breath. EXAM: CHEST  2 VIEW COMPARISON:  Chest radiograph January 19, 2012 FINDINGS: Cardiomediastinal silhouette is normal. No pleural effusions or focal consolidations. Bronchitic changes and bibasilar strandy densities. Trachea projects midline and there is no pneumothorax. Soft tissue planes and included osseous structures are non-suspicious. IMPRESSION: Bronchitic changes and bibasilar atelectasis. Electronically Signed   By: Awilda Metro M.D.   On: 07/11/2017 17:18    Procedures Procedures (including critical care time)  Medications Ordered in ED Medications  HYDROcodone-acetaminophen (NORCO/VICODIN) 5-325 MG per tablet 2 tablet (not administered)  benzonatate (TESSALON) capsule 200 mg (not administered)  azithromycin (ZITHROMAX) tablet 500 mg (not administered)  albuterol (PROVENTIL) (2.5 MG/3ML) 0.083% nebulizer solution 5 mg (5 mg Nebulization Not Given 07/11/17 1617)     Initial Impression / Assessment and Plan / ED Course  I have reviewed the triage vital signs and the nursing notes.  Pertinent labs & imaging results that were available during my care of the patient were reviewed by me and considered in my medical decision making (see chart for details).     Patient with cough and subjective fevers.  CXR show bronchitic changes.  Labs are reassuring.  Patient  stable appearing.  Doubt PE or ACS.  Will treat for bronchitis/early pneumonia.  Final Clinical Impressions(s) / ED Diagnoses   Final diagnoses:  Bronchitis  Cough    New Prescriptions New Prescriptions   AZITHROMYCIN (ZITHROMAX) 250 MG TABLET    Take 1 tablet (250 mg total) by mouth daily. Starting on 9/25, take 1 tablet daily until gone.   BENZONATATE (TESSALON) 100 MG CAPSULE    Take 2 capsules (200 mg total) by mouth 2 (two) times daily as needed for cough.     Roxy Horseman, PA-C 07/12/17 0000    Shon Baton, MD 07/13/17 [phone]

## 2017-07-12 MED ORDER — AZITHROMYCIN 250 MG PO TABS: 250.0000 mg | ORAL_TABLET | Freq: Every day | ORAL | 0 refills | Status: DC

## 2017-07-12 MED ORDER — BENZONATATE 100 MG PO CAPS: 200.0000 mg | ORAL_CAPSULE | Freq: Two times a day (BID) | ORAL | 0 refills | Status: DC | PRN

## 2017-07-12 NOTE — ED Notes (Signed)
Patient Alert and oriented X4. Stable and ambulatory. Patient verbalized understanding of the discharge instructions.  Patient belongings were taken by the patient.  

## 2017-07-15 ENCOUNTER — Ambulatory Visit (INDEPENDENT_AMBULATORY_CARE_PROVIDER_SITE_OTHER): Payer: BLUE CROSS/BLUE SHIELD | Admitting: Urgent Care

## 2017-07-15 ENCOUNTER — Encounter: Payer: Self-pay | Admitting: Urgent Care

## 2017-07-15 VITALS — HR 94 | Temp 97.9°F | Resp 18 | Ht 67.0 in | Wt 214.6 lb

## 2017-07-15 DIAGNOSIS — J209 Acute bronchitis, unspecified: Secondary | ICD-10-CM

## 2017-07-15 DIAGNOSIS — R062 Wheezing: Secondary | ICD-10-CM | POA: Diagnosis not present

## 2017-07-15 DIAGNOSIS — R059 Cough, unspecified: Secondary | ICD-10-CM

## 2017-07-15 DIAGNOSIS — R05 Cough: Secondary | ICD-10-CM | POA: Diagnosis not present

## 2017-07-15 MED ORDER — PREDNISONE 20 MG PO TABS: ORAL_TABLET | ORAL | 0 refills | Status: DC

## 2017-07-15 MED ORDER — CETIRIZINE HCL 10 MG PO TABS: 10.0000 mg | ORAL_TABLET | Freq: Every day | ORAL | 11 refills | Status: AC

## 2017-07-15 MED ORDER — ACETAMINOPHEN-CODEINE 120-12 MG/5ML PO SOLN: 10.0000 mL | Freq: Every evening | ORAL | 0 refills | Status: DC | PRN

## 2017-07-15 MED ORDER — PSEUDOEPHEDRINE HCL ER 120 MG PO TB12: 120.0000 mg | ORAL_TABLET | Freq: Two times a day (BID) | ORAL | 3 refills | Status: DC

## 2017-07-15 MED ORDER — ALBUTEROL SULFATE HFA 108 (90 BASE) MCG/ACT IN AERS: 2.0000 | INHALATION_SPRAY | Freq: Four times a day (QID) | RESPIRATORY_TRACT | 1 refills | Status: AC | PRN

## 2017-07-15 NOTE — Progress Notes (Signed)
   MRN: [id number] DOB: 11/05/1965  Subjective:   Rebecca Grant is a 51 y.o. female presenting for chief complaint of Cough (seen in ED; diagnosed with bronchitis) and Nasal Congestion  Patient was seen on 07/11/2017, treated for bronchitis with azithromycin, offered Tessalon capsules. She has finished azithromycin, Tessalon helps during the day.  However, reports mild wheezing, subjective fever, persistent cough that is worse at night and interrupts her sleep. Denies chest pain, shob, n/v, abdominal pain.  Rebecca Grant has a current medication list which includes the following prescription(s): azithromycin, benzonatate, clobetasol cream, fluticasone, ipratropium, and lorazepam. Also has No Known Allergies.  Rebecca Grant  has a past medical history of Anxiety; Back ache; Endometriosis; and Insomnia. Also  has a past surgical history that includes Abdominal hysterectomy and Appendectomy.  Objective:   Vitals: Pulse 94   Temp 97.9 F (36.6 C) (Oral)   Resp 18   Ht  (1.702 m)   Wt 214 lb 9.6 oz (97.3 kg)   SpO2 98%   BMI 33.61 kg/m   Physical Exam  Constitutional: She is oriented to person, place, and time. She appears well-developed and well-nourished.  Cardiovascular: Normal rate, regular rhythm and intact distal pulses.  Exam reveals no gallop and no friction rub.   No murmur heard. Pulmonary/Chest: No respiratory distress. She has no wheezes. She has no rales.  Neurological: She is alert and oriented to person, place, and time.    Assessment and Plan :   1. Acute bronchitis, unspecified organism 2. Cough 3. Wheezing - Start prednisone course, albuterol inhaler. Patient requested cough syrup so I provided a 5 day supply. Start Zyrtec and Sudafed. Counseled patient on potential for adverse effects with medications prescribed today, patient verbalized understanding. Return-to-clinic precautions discussed, patient verbalized understanding.   Wallis Bamberg, PA-C Primary [facility] at [facility] [phone] 07/15/2017  5:45 PM

## 2017-07-15 NOTE — Patient Instructions (Addendum)
Acute Bronchitis, Adult Acute bronchitis is sudden (acute) swelling of the air tubes (bronchi) in the lungs. Acute bronchitis causes these tubes to fill with mucus, which can make it hard to breathe. It can also cause coughing or wheezing. In adults, acute bronchitis usually goes away within 2 weeks. A cough caused by bronchitis may last up to 3 weeks. Smoking, allergies, and asthma can make the condition worse. Repeated episodes of bronchitis may cause further lung problems, such as chronic obstructive pulmonary disease (COPD). What are the causes? This condition can be caused by germs and by substances that irritate the lungs, including:  Cold and flu viruses. This condition is most often caused by the same virus that causes a cold.  Bacteria.  Exposure to tobacco smoke, dust, fumes, and air pollution. What increases the risk? This condition is more likely to develop in people who:  Have close contact with someone with acute bronchitis.  Are exposed to lung irritants, such as tobacco smoke, dust, fumes, and vapors.  Have a weak immune system.  Have a respiratory condition such as asthma. What are the signs or symptoms? Symptoms of this condition include:  A cough.  Coughing up clear, yellow, or green mucus.  Wheezing.  Chest congestion.  Shortness of breath.  A fever.  Body aches.  Chills.  A sore throat. How is this diagnosed? This condition is usually diagnosed with a physical exam. During the exam, your health care provider may order tests, such as chest X-rays, to rule out other conditions. He or she may also:  Test a sample of your mucus for bacterial infection.  Check the level of oxygen in your blood. This is done to check for pneumonia.  Do a chest X-ray or lung function testing to rule out pneumonia and other conditions.  Perform blood tests. Your health care provider will also ask about your symptoms and medical history. How is this treated? Most cases  of acute bronchitis clear up over time without treatment. Your health care provider may recommend:  Drinking more fluids. Drinking more makes your mucus thinner, which may make it easier to breathe.  Taking a medicine for a fever or cough.  Taking an antibiotic medicine.  Using an inhaler to help improve shortness of breath and to control a cough.  Using a cool mist vaporizer or humidifier to make it easier to breathe. Follow these instructions at home: Medicines   Take over-the-counter and prescription medicines only as told by your health care provider.  If you were prescribed an antibiotic, take it as told by your health care provider. Do not stop taking the antibiotic even if you start to feel better. General instructions   Get plenty of rest.  Drink enough fluids to keep your urine clear or pale yellow.  Avoid smoking and secondhand smoke. Exposure to cigarette smoke or irritating chemicals will make bronchitis worse. If you smoke and you need help quitting, ask your health care provider. Quitting smoking will help your lungs heal faster.  Use an inhaler, cool mist vaporizer, or humidifier as told by your health care provider.  Keep all follow-up visits as told by your health care provider. This is important. How is this prevented? To lower your risk of getting this condition again:  Wash your hands often with soap and water. If soap and water are not available, use hand sanitizer.  Avoid contact with people who have cold symptoms.  Try not to touch your hands to your mouth, nose, or   eyes.  Make sure to get the flu shot every year. Contact a health care provider if:  Your symptoms do not improve in 2 weeks of treatment. Get help right away if:  You cough up blood.  You have chest pain.  You have severe shortness of breath.  You become dehydrated.  You faint or keep feeling like you are going to faint.  You keep vomiting.  You have a severe headache.  Your  fever or chills gets worse. This information is not intended to replace advice given to you by your health care provider. Make sure you discuss any questions you have with your health care provider. Document Released: 11/11/2004 Document Revised: 04/28/2016 Document Reviewed: 03/24/2016 Elsevier Interactive Patient Education  2017 [employer]     IF you received an x-ray today, you will receive an invoice from [facility]. Please contact [facility] at [phone] with questions or concerns regarding your invoice.   IF you received labwork today, you will receive an invoice from [facility]. Please contact [facility] at [phone] with questions or concerns regarding your invoice.   Our billing staff will not be able to assist you with questions regarding bills from these companies.  You will be contacted with the lab results as soon as they are available. The fastest way to get your results is to activate your My Chart account. Instructions are located on the last page of this paperwork. If you have not heard from us regarding the results in 2 weeks, please contact this office.     

## 2017-07-23 ENCOUNTER — Encounter: Payer: Self-pay | Admitting: Physician Assistant

## 2017-07-23 ENCOUNTER — Ambulatory Visit (INDEPENDENT_AMBULATORY_CARE_PROVIDER_SITE_OTHER): Payer: BLUE CROSS/BLUE SHIELD | Admitting: Physician Assistant

## 2017-07-23 VITALS — BP 130/90 | HR 83 | Temp 98.4°F | Resp 18 | Ht 67.0 in | Wt 217.8 lb

## 2017-07-23 DIAGNOSIS — G4700 Insomnia, unspecified: Secondary | ICD-10-CM | POA: Diagnosis not present

## 2017-07-23 DIAGNOSIS — J309 Allergic rhinitis, unspecified: Secondary | ICD-10-CM | POA: Diagnosis not present

## 2017-07-23 DIAGNOSIS — J209 Acute bronchitis, unspecified: Secondary | ICD-10-CM

## 2017-07-23 MED ORDER — FLUTICASONE PROPIONATE 50 MCG/ACT NA SUSP: 2.0000 | Freq: Every day | NASAL | 3 refills | Status: DC

## 2017-07-23 MED ORDER — LORAZEPAM 1 MG PO TABS: ORAL_TABLET | ORAL | 0 refills | Status: DC

## 2017-07-23 MED ORDER — ACETAMINOPHEN-CODEINE 120-12 MG/5ML PO SOLN: 10.0000 mL | Freq: Every evening | ORAL | 0 refills | Status: DC | PRN

## 2017-07-23 NOTE — Progress Notes (Signed)
Subjective:    Patient ID: Rebecca Grant, female    DOB: [birth year], 51 y.o.   MRN: [id number]  HPI Chief Complaint  Patient presents with  . Bronchitis    Pt states she is better and states she still has a lot of coughing at night.  . Follow-up  . Medication Refill    Lorazepam 1 MG, acetaminophen-codeine   Patient returns today for follow up of her of cough (seen in ED; diagnosed with bronchitis on 07/11/17) and Nasal Congestion. She was treated for bronchitis with azithromycin and offered Tessalon capsules at the ED. She finished azithromycin and saw Wallis Bamberg PA-C on 07/15/2017 for persistent cough. She was started on prednisone course and albuterol inhaler. Patient requested cough syrup so a 5 day supply was provided. She also was started on Zyrtec and Sudafed.   Currently, she states she is doing about 60% better since her last office visit. She still reports of tiredness, shortness of breath on exertion, and persistent cough. She states the cough persists and wakes her up every single night still. Patient reports the cough syrup did help significantly, but it did not last her very long. She requests more of the cough syrup because it was effective in managing the cough. She continues on the Zyrtec, which has been helping.   Review of Systems  Constitutional: Positive for fatigue. Negative for chills and fever.  HENT: Positive for congestion, rhinorrhea, sinus pain and sinus pressure. Negative for ear pain, postnasal drip, sneezing, sore throat and trouble swallowing.   Respiratory: Positive for cough and shortness of breath.   Cardiovascular: Positive for palpitations (unchanged). Negative for chest pain.  Gastrointestinal: Negative for abdominal pain, constipation, diarrhea, nausea and vomiting.  Musculoskeletal: Negative for arthralgias and myalgias.  Neurological: Negative for dizziness, syncope, weakness, light-headedness and headaches.   Patient Active Problem List   Diagnosis  Date Noted  . Nocturia 03/19/2014  . Obesity (BMI 30-39.9) 03/19/2014  . Anxiety 03/08/2013  . Insomnia    Prior to Admission medications   Medication Sig Start Date End Date Taking? Authorizing Provider  albuterol (PROVENTIL HFA;VENTOLIN HFA) 108 (90 Base) MCG/ACT inhaler Inhale 2 puffs into the lungs every 6 (six) hours as needed. 07/15/17  Yes Wallis Bamberg, PA-C  cetirizine (ZYRTEC) 10 MG tablet Take 1 tablet (10 mg total) by mouth daily. 07/15/17  Yes Wallis Bamberg, PA-C  fluticasone (FLONASE) 50 MCG/ACT nasal spray Place 2 sprays into both nostrils daily. 03/19/14  Yes Jeffery, Chelle, PA-C  LORazepam (ATIVAN) 1 MG tablet TAKE 1 TABLET BY MOUTH TWICE DAILY AS NEEDED FOR ANXIETY OR SLEEP 11/25/15  Yes Jeffery, Chelle, PA-C  acetaminophen-codeine 120-12 MG/5ML solution Take 10 mLs by mouth at bedtime as needed for moderate pain. Patient not taking: Reported on 07/23/2017 07/15/17   Wallis Bamberg, PA-C  azithromycin (ZITHROMAX) 250 MG tablet Take 1 tablet (250 mg total) by mouth daily. Starting on 9/25, take 1 tablet daily until gone. Patient not taking: Reported on 07/23/2017 07/11/17   Roxy Horseman, PA-C  benzonatate (TESSALON) 100 MG capsule Take 2 capsules (200 mg total) by mouth 2 (two) times daily as needed for cough. Patient not taking: Reported on 07/23/2017 07/12/17   Roxy Horseman, PA-C  clobetasol cream (TEMOVATE) 0.05 % APPLY TOPICALLY TWICE DAILY AS NEEDED 07/18/15   Leotis Shames, Chelle, PA-C  ipratropium (ATROVENT) 0.06 % nasal spray Place 2 sprays into the nose 3 (three) times daily. Patient not taking: Reported on 07/23/2017 12/04/13   Sondra Barges,  PA-C  predniSONE (DELTASONE) 20 MG tablet Take 2 tablets daily with breakfast. Patient not taking: Reported on 07/23/2017 07/15/17   Wallis Bamberg, PA-C  pseudoephedrine (SUDAFED 12 HOUR) 120 MG 12 hr tablet Take 1 tablet (120 mg total) by mouth 2 (two) times daily. Patient not taking: Reported on 07/23/2017 07/15/17   Wallis Bamberg, PA-C   No Known  Allergies. Social History   Social History  . Marital status: Divorced    Spouse name: n/a  . Number of children: 1  . Years of education: 11   Occupational History  . Education officer, community Unemployed   Social History Main Topics  . Smoking status: Current Every Day Smoker    Years: 6.00    Types: Cigarettes  . Smokeless tobacco: Never Used     Comment: smokes for stress relief, down to 1 cigarette daily  . Alcohol use No  . Drug use: No  . Sexual activity: No   Other Topics Concern  . Not on file   Social History Narrative   Lives alone.      Objective:   Physical Exam  Constitutional: She is oriented to person, place, and time. She appears well-developed and well-nourished. No distress.  HENT:  Head: Normocephalic and atraumatic.  Right Ear: External ear normal.  Left Ear: External ear normal.  Mouth/Throat: Oropharynx is clear and moist.  Neck: Neck supple. No JVD present. No tracheal deviation present. No thyromegaly present.  Cardiovascular: Normal rate, regular rhythm and intact distal pulses.  Exam reveals no gallop and no friction rub.   No murmur heard. Pulmonary/Chest: Effort normal and breath sounds normal. No stridor. No respiratory distress. She has no wheezes. She has no rales. She exhibits no tenderness.  Lymphadenopathy:    She has no cervical adenopathy.  Neurological: She is alert and oriented to person, place, and time.  Skin: Skin is warm and dry. No rash noted. She is not diaphoretic. No erythema. No pallor.      Assessment & Plan:  1. Insomnia, unspecified type - LORazepam (ATIVAN) 1 MG tablet; TAKE 1 TABLET BY MOUTH TWICE DAILY AS NEEDED FOR ANXIETY OR SLEEP  Dispense: 60 tablet; Refill: 0  2. Acute bronchitis, unspecified organism - acetaminophen-codeine 120-12 MG/5ML solution; Take 10 mLs by mouth at bedtime as needed (cough). DO NOT TAKE WITH LORAZEPAM.  Dispense: 90 mL; Refill: 0  3. Allergic rhinitis, unspecified seasonality,  unspecified trigger - fluticasone (FLONASE) 50 MCG/ACT nasal spray; Place 2 sprays into both nostrils daily.  Dispense: 48 g; Refill: 3  Follow up if symptoms persist for more than 7-10 days.   Respectfully, Gala Romney PA-S 2019

## 2017-07-23 NOTE — Progress Notes (Signed)
Patient ID: Rebecca Grant, female    DOB: 1965/11/01, 51 y.o.   MRN: [id number]  PCP: Porfirio Oar, PA-C  Chief Complaint  Patient presents with  . Bronchitis    Pt states she is better and states she still has a lot of coughing at night.  . Follow-up  . Medication Refill    Lorazepam 1 MG, acetaminophen-codeine    Subjective:   Presents for evaluation of persistent cough, following treatment for bronchitis and for medication refills.  She presented to the ED on 07/11/2017 with 2 days of cough, progressively worsening, associated with subjective fever and LEFT rib pain. Exam was unremarkable. EKG was remarkable for tachycardia and possible LEFT atrial enlargement. CXR revealed bronchitic changes and bibasilar atelectasis. She was prescribed azithromycin and benzonatate for treatment of bronchitis/early pneumonia.  She presented here on 9/28 reporting wheezing, subjective fever and persistent cough that was interrupting her sleep. Exam again normal. She was prescribed a course of oral prednisone, albuterol inhaler and hydrocodone-containing cough medicine.  Overall, she feels good now, but the cough persists at night and keeps her awake. Non-productive. No fever, chills.    Review of Systems As above.    Patient Active Problem List   Diagnosis Date Noted  . Nocturia 03/19/2014  . Obesity (BMI 30-39.9) 03/19/2014  . Anxiety 03/08/2013  . Insomnia      Prior to Admission medications   Medication Sig Start Date End Date Taking? Authorizing Provider  albuterol (PROVENTIL HFA;VENTOLIN HFA) 108 (90 Base) MCG/ACT inhaler Inhale 2 puffs into the lungs every 6 (six) hours as needed. 07/15/17  Yes Wallis Bamberg, PA-C  cetirizine (ZYRTEC) 10 MG tablet Take 1 tablet (10 mg total) by mouth daily. 07/15/17  Yes Wallis Bamberg, PA-C  fluticasone (FLONASE) 50 MCG/ACT nasal spray Place 2 sprays into both nostrils daily. 03/19/14  Yes Tinlee Navarrette, PA-C  LORazepam (ATIVAN) 1 MG tablet  TAKE 1 TABLET BY MOUTH TWICE DAILY AS NEEDED FOR ANXIETY OR SLEEP 11/25/15  Yes Rhoderick Farrel, PA-C     No Known Allergies     Objective:  Physical Exam  Constitutional: She is oriented to person, place, and time. She appears well-developed and well-nourished. She is active and cooperative. No distress.  BP 130/90 (BP Location: Right Arm, Patient Position: Sitting, Cuff Size: Large)   Pulse 83   Temp 98.4 F (36.9 C) (Oral)   Resp 18   Ht  (1.702 m)   Wt 217 lb 12.8 oz (98.8 kg)   SpO2 98%   BMI 34.11 kg/m   HENT:  Head: Normocephalic and atraumatic.  Right Ear: Hearing normal.  Left Ear: Hearing normal.  Eyes: Conjunctivae are normal. No scleral icterus.  Neck: Normal range of motion. Neck supple. No thyromegaly present.  Cardiovascular: Normal rate, regular rhythm and normal heart sounds.   Pulses:      Radial pulses are 2+ on the right side, and 2+ on the left side.  Pulmonary/Chest: Effort normal and breath sounds normal.  Lymphadenopathy:       Head (right side): No tonsillar, no preauricular, no posterior auricular and no occipital adenopathy present.       Head (left side): No tonsillar, no preauricular, no posterior auricular and no occipital adenopathy present.    She has no cervical adenopathy.       Right: No supraclavicular adenopathy present.       Left: No supraclavicular adenopathy present.  Neurological: She is alert and oriented to person,  place, and time. No sensory deficit.  Skin: Skin is warm, dry and intact. No rash noted. No cyanosis or erythema. Nails show no clubbing.  Psychiatric: She has a normal mood and affect. Her speech is normal and behavior is normal.       Assessment & Plan:   Problem List Items Addressed This Visit    Insomnia - Primary    Generally well controlled until recent illness and cough.      Relevant Medications   LORazepam (ATIVAN) 1 MG tablet    Other Visit Diagnoses    Acute bronchitis, unspecified organism         post-infection cough persists.   Relevant Medications   acetaminophen-codeine 120-12 MG/5ML solution   Allergic rhinitis, unspecified seasonality, unspecified trigger       Relevant Medications   fluticasone (FLONASE) 50 MCG/ACT nasal spray       Return for Wellness Exam in the next several months; get flu vaccine when you are well.   Fernande Bras, PA-C Primary Care at [facility]

## 2017-07-23 NOTE — Patient Instructions (Signed)
     IF you received an x-ray today, you will receive an invoice from [facility]. Please contact [facility] at [phone] with questions or concerns regarding your invoice.   IF you received labwork today, you will receive an invoice from [facility]. Please contact [facility] at [phone] with questions or concerns regarding your invoice.   Our billing staff will not be able to assist you with questions regarding bills from these companies.  You will be contacted with the lab results as soon as they are available. The fastest way to get your results is to activate your My Chart account. Instructions are located on the last page of this paperwork. If you have not heard from us regarding the results in 2 weeks, please contact this office.     

## 2017-07-28 NOTE — Assessment & Plan Note (Signed)
Generally well controlled until recent illness and cough.

## 2017-08-30 ENCOUNTER — Ambulatory Visit: Payer: BLUE CROSS/BLUE SHIELD | Admitting: Physician Assistant

## 2017-09-07 ENCOUNTER — Encounter: Payer: Self-pay | Admitting: Physician Assistant

## 2017-09-07 ENCOUNTER — Other Ambulatory Visit: Payer: Self-pay

## 2017-09-07 ENCOUNTER — Ambulatory Visit: Payer: BLUE CROSS/BLUE SHIELD | Admitting: Physician Assistant

## 2017-09-07 VITALS — BP 112/80 | HR 97 | Temp 98.5°F | Resp 18 | Ht 67.0 in | Wt 214.8 lb

## 2017-09-07 DIAGNOSIS — Z23 Encounter for immunization: Secondary | ICD-10-CM | POA: Diagnosis not present

## 2017-09-07 DIAGNOSIS — J309 Allergic rhinitis, unspecified: Secondary | ICD-10-CM

## 2017-09-07 DIAGNOSIS — R2230 Localized swelling, mass and lump, unspecified upper limb: Secondary | ICD-10-CM | POA: Diagnosis not present

## 2017-09-07 MED ORDER — AZELASTINE HCL 0.1 % NA SOLN: 2.0000 | Freq: Two times a day (BID) | NASAL | 99 refills | Status: DC

## 2017-09-07 NOTE — Patient Instructions (Addendum)
  I think this is a lipoma. If it becomes tender, or grows, let me know. Or, if you just want it out, I will refer you.    IF you received an x-ray today, you will receive an invoice from [facility]. Please contact [facility] at [phone] with questions or concerns regarding your invoice.   IF you received labwork today, you will receive an invoice from [facility]. Please contact [facility] at [phone] with questions or concerns regarding your invoice.   Our billing staff will not be able to assist you with questions regarding bills from these companies.  You will be contacted with the lab results as soon as they are available. The fastest way to get your results is to activate your My Chart account. Instructions are located on the last page of this paperwork. If you have not heard from us regarding the results in 2 weeks, please contact this office.

## 2017-09-07 NOTE — Progress Notes (Signed)
     Patient ID: Rebecca Grant, female    DOB: 05/29/1966, 51 y.o.   MRN: [id number]  PCP: Porfirio OarJeffery, Syla Devoss, PA-C  Chief Complaint  Patient presents with  . Shoulder Lump    left shoulder lump, Pt noticed the lump x3 days ago, Pt states lump doesn't hurt.    Subjective:   Presents for evaluation of a non-tender lump on the LEFT shoulder that she noticed 3 days ago when she was scratching an itch.  No trauma or injury to the area. No skin changes. No other lumps and no previous lumps like this.    Review of Systems As above    Patient Active Problem List   Diagnosis Date Noted  . H/O: hysterectomy 06/03/2016  . Sciatica 06/03/2016  . Nocturia 03/19/2014  . Obesity (BMI 30-39.9) 03/19/2014  . Anxiety 03/08/2013  . Insomnia      Prior to Admission medications   Medication Sig Start Date End Date Taking? Authorizing Provider  acetaminophen-codeine 120-12 MG/5ML solution Take 10 mLs by mouth at bedtime as needed (cough). DO NOT TAKE WITH LORAZEPAM. 07/23/17  Yes Bland Rudzinski, PA-C  albuterol (PROVENTIL HFA;VENTOLIN HFA) 108 (90 Base) MCG/ACT inhaler Inhale 2 puffs into the lungs every 6 (six) hours as needed. 07/15/17  Yes Wallis BambergMani, Mario, PA-C  cetirizine (ZYRTEC) 10 MG tablet Take 1 tablet (10 mg total) by mouth daily. 07/15/17  Yes Wallis BambergMani, Mario, PA-C  clobetasol cream (TEMOVATE) 0.05 % APPLY TOPICALLY TWICE DAILY AS NEEDED 07/18/15  Yes Kumar Falwell, PA-C  fluticasone (FLONASE) 50 MCG/ACT nasal spray Place 2 sprays into both nostrils daily. 07/23/17  Yes Mychaela Lennartz, PA-C  LORazepam (ATIVAN) 1 MG tablet TAKE 1 TABLET BY MOUTH TWICE DAILY AS NEEDED FOR ANXIETY OR SLEEP 07/23/17  Yes Canyon Willow, PA-C     No Known Allergies     Objective:  Physical Exam  Constitutional: She is oriented to person, place, and time. She appears well-developed and well-nourished. She is active and cooperative. No distress.  BP 112/80 (BP Location: Left Arm, Patient Position: Sitting,  Cuff Size: Large)   Pulse 97   Temp 98.5 F (36.9 C) (Oral)   Resp 18   Ht 5\' 7"  (1.702 m)   Wt 214 lb 12.8 oz (97.4 kg)   SpO2 95%   BMI 33.64 kg/m    Eyes: Conjunctivae are normal.  Pulmonary/Chest: Effort normal.  Musculoskeletal:       Arms: Neurological: She is alert and oriented to person, place, and time.  Psychiatric: She has a normal mood and affect. Her speech is normal and behavior is normal.           Assessment & Plan:   1. 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 Monitor. If grows in size or changes in any way, return for re-evaluation.   2. Need for influenza vaccination - Flu Vaccine QUAD 36+ mos IM  3. Allergic rhinitis, unspecified seasonality, unspecified trigger Inadequate control of symptoms with Flonase, though it has helped. Add azelastine NS. Consider montelukast if needed. - azelastine (ASTELIN) 0.1 % nasal spray; Place 2 sprays into both nostrils 2 (two) times daily. Use in each nostril as directed  Dispense: 30 mL; Refill: PRN    Return if symptoms worsen or fail to improve.   Fernande Brashelle S. Valora Norell, PA-C Primary Care at [facility]

## 2017-09-20 ENCOUNTER — Other Ambulatory Visit: Payer: Self-pay | Admitting: Physician Assistant

## 2017-09-20 DIAGNOSIS — G4700 Insomnia, unspecified: Secondary | ICD-10-CM

## 2017-09-21 NOTE — Telephone Encounter (Signed)
Controlled substance 

## 2017-09-21 NOTE — Telephone Encounter (Signed)
Rx sent electronically.  Meds ordered this encounter  Medications  . LORazepam (ATIVAN) 1 MG tablet    Sig: TAKE 1 TABLET BY MOUTH TWICE DAILY AS NEEDED FOR ANXIETY OR SLEEP    Dispense:  60 tablet    Refill:  0

## 2017-10-03 ENCOUNTER — Other Ambulatory Visit: Payer: Self-pay

## 2017-10-03 ENCOUNTER — Encounter: Payer: Self-pay | Admitting: Family Medicine

## 2017-10-03 ENCOUNTER — Ambulatory Visit (INDEPENDENT_AMBULATORY_CARE_PROVIDER_SITE_OTHER): Payer: BLUE CROSS/BLUE SHIELD | Admitting: Family Medicine

## 2017-10-03 VITALS — BP 118/76 | HR 120 | Temp 99.6°F | Ht 67.0 in | Wt 215.6 lb

## 2017-10-03 DIAGNOSIS — J209 Acute bronchitis, unspecified: Secondary | ICD-10-CM | POA: Diagnosis not present

## 2017-10-03 MED ORDER — GUAIFENESIN-CODEINE 100-10 MG/5ML PO SOLN: 5.0000 mL | Freq: Every day | ORAL | 0 refills | Status: DC

## 2017-10-03 MED ORDER — AZITHROMYCIN 250 MG PO TABS: ORAL_TABLET | ORAL | 0 refills | Status: DC

## 2017-10-03 NOTE — Patient Instructions (Addendum)
   IF you received an x-ray today, you will receive an invoice from [facility]. Please contact [facility] at [phone] with questions or concerns regarding your invoice.   IF you received labwork today, you will receive an invoice from [facility]. Please contact [facility] at [phone] with questions or concerns regarding your invoice.   Our billing staff will not be able to assist you with questions regarding bills from these companies.  You will be contacted with the lab results as soon as they are available. The fastest way to get your results is to activate your My Chart account. Instructions are located on the last page of this paperwork. If you have not heard from us regarding the results in 2 weeks, please contact this office.    We recommend that you schedule a mammogram for breast cancer screening. Typically, you do not need a referral to do this. Please contact a local imaging center to schedule your mammogram.  [facility] - [phone]  *ask for the Radiology Department [facility] - [phone] or [phone]  [facility] - [phone] [facility] - [phone] [facility] - [phone]  *ask for the Radiology Department [facility] - [phone]  *ask for the Radiology Department [facility] - [phone]  *ask for the Mammography Department [facility] - [phone] Acute Bronchitis, Adult Acute bronchitis is sudden (acute) swelling of the air tubes (bronchi) in the lungs. Acute bronchitis causes these tubes to fill with mucus, which can make it hard to breathe. It can also cause coughing or wheezing. In adults, acute bronchitis usually goes away within 2 weeks. A cough caused by bronchitis may last up to 3 weeks. Smoking, allergies, and asthma can make the condition worse. Repeated episodes of bronchitis may cause further lung  problems, such as chronic obstructive pulmonary disease (COPD). What are the causes? This condition can be caused by germs and by substances that irritate the lungs, including:  Cold and flu viruses. This condition is most often caused by the same virus that causes a cold.  Bacteria.  Exposure to tobacco smoke, dust, fumes, and air pollution.  What increases the risk? This condition is more likely to develop in people who:  Have close contact with someone with acute bronchitis.  Are exposed to lung irritants, such as tobacco smoke, dust, fumes, and vapors.  Have a weak immune system.  Have a respiratory condition such as asthma.  What are the signs or symptoms? Symptoms of this condition include:  A cough.  Coughing up clear, yellow, or green mucus.  Wheezing.  Chest congestion.  Shortness of breath.  A fever.  Body aches.  Chills.  A sore throat.  How is this diagnosed? This condition is usually diagnosed with a physical exam. During the exam, your health care provider may order tests, such as chest X-rays, to rule out other conditions. He or she may also:  Test a sample of your mucus for bacterial infection.  Check the level of oxygen in your blood. This is done to check for pneumonia.  Do a chest X-ray or lung function testing to rule out pneumonia and other conditions.  Perform blood tests.  Your health care provider will also ask about your symptoms and medical history. How is this treated? Most cases of acute bronchitis clear up over time without treatment. Your health care provider may recommend:  Drinking more fluids. Drinking more   makes your mucus thinner, which may make it easier to breathe.  Taking a medicine for a fever or cough.  Taking an antibiotic medicine.  Using an inhaler to help improve shortness of breath and to control a cough.  Using a cool mist vaporizer or humidifier to make it easier to breathe.  Follow these instructions at  home: Medicines  Take over-the-counter and prescription medicines only as told by your health care provider.  If you were prescribed an antibiotic, take it as told by your health care provider. Do not stop taking the antibiotic even if you start to feel better. General instructions  Get plenty of rest.  Drink enough fluids to keep your urine clear or pale yellow.  Avoid smoking and secondhand smoke. Exposure to cigarette smoke or irritating chemicals will make bronchitis worse. If you smoke and you need help quitting, ask your health care provider. Quitting smoking will help your lungs heal faster.  Use an inhaler, cool mist vaporizer, or humidifier as told by your health care provider.  Keep all follow-up visits as told by your health care provider. This is important. How is this prevented? To lower your risk of getting this condition again:  Wash your hands often with soap and water. If soap and water are not available, use hand sanitizer.  Avoid contact with people who have cold symptoms.  Try not to touch your hands to your mouth, nose, or eyes.  Make sure to get the flu shot every year.  Contact a health care provider if:  Your symptoms do not improve in 2 weeks of treatment. Get help right away if:  You cough up blood.  You have chest pain.  You have severe shortness of breath.  You become dehydrated.  You faint or keep feeling like you are going to faint.  You keep vomiting.  You have a severe headache.  Your fever or chills gets worse. This information is not intended to replace advice given to you by your health care provider. Make sure you discuss any questions you have with your health care provider. Document Released: 11/11/2004 Document Revised: 04/28/2016 Document Reviewed: 03/24/2016 Elsevier Interactive Patient Education  2017 [employer]  

## 2017-10-03 NOTE — Progress Notes (Signed)
Chief Complaint  Patient presents with  . bronchitis (cough)    onset: Saturday, tried thera flu severe cold/cough twice yesterday with no relief, rn, no fevers and chest congestion, no drainage per pt she tries to cough it up but it won't come up    HPI  Pt reports that she was given astelin spray which is not helping Started having cough and congestion starting 10/01/2017 She reports that she had some sweats and chills at home She took theraflu severe cold and cough on 10/01/17 and 10/02/17 She states that it made her sleepy but the cough was unchanged Her cough is nonproductive She feels like she is wheezing She has been drinking only a little water She prefers to drink warm liquids    Past Medical History:  Diagnosis Date  . Anxiety   . Back ache   . Endometriosis    s/p hysterectomy  . Insomnia     Current Outpatient Medications  Medication Sig Dispense Refill  . acetaminophen-codeine 120-12 MG/5ML solution Take 10 mLs by mouth at bedtime as needed (cough). DO NOT TAKE WITH LORAZEPAM. 90 mL 0  . albuterol (PROVENTIL HFA;VENTOLIN HFA) 108 (90 Base) MCG/ACT inhaler Inhale 2 puffs into the lungs every 6 (six) hours as needed. 1 Inhaler 1  . azelastine (ASTELIN) 0.1 % nasal spray Place 2 sprays into both nostrils 2 (two) times daily. Use in each nostril as directed 30 mL PRN  . cetirizine (ZYRTEC) 10 MG tablet Take 1 tablet (10 mg total) by mouth daily. 30 tablet 11  . clobetasol cream (TEMOVATE) 0.05 % APPLY TOPICALLY TWICE DAILY AS NEEDED 30 g 0  . fluticasone (FLONASE) 50 MCG/ACT nasal spray Place 2 sprays into both nostrils daily. 48 g 3  . LORazepam (ATIVAN) 1 MG tablet TAKE 1 TABLET BY MOUTH TWICE DAILY AS NEEDED FOR ANXIETY OR SLEEP 60 tablet 0  . azithromycin (ZITHROMAX) 250 MG tablet Take 2 tablets on day 1 then one tablet each day after 6 tablet 0   No current facility-administered medications for this visit.     Allergies: No Known Allergies  Past Surgical  History:  Procedure Laterality Date  . ABDOMINAL HYSTERECTOMY    . APPENDECTOMY      Social History   Socioeconomic History  . Marital status: Divorced    Spouse name: n/a  . Number of children: 1  . Years of education: 3012  . Highest education level: None  Social Needs  . Financial resource strain: None  . Food insecurity - worry: None  . Food insecurity - inability: None  . Transportation needs - medical: None  . Transportation needs - non-medical: None  Occupational History  . Occupation: Control and instrumentation engineerassistant store manager    Employer: UNEMPLOYED  Tobacco Use  . Smoking status: Former Smoker    Years: 6.00    Types: Cigarettes  . Smokeless tobacco: Never Used  . Tobacco comment: smokes for stress relief, down to 1 cigarette daily  Substance and Sexual Activity  . Alcohol use: No    Alcohol/week: 0.0 oz  . Drug use: No  . Sexual activity: No    Birth control/protection: Abstinence, Surgical  Other Topics Concern  . None  Social History Narrative   Lives alone.    Family History  Problem Relation Age of Onset  . Hypertension Mother   . Gout Maternal Grandmother   . Heart disease Maternal Grandfather   . Hypertension Maternal Grandfather   . Gout Maternal Grandfather   . Seizures  Sister [id number]       undetermined etiology     ROS Review of Systems See HPI Constitution: No fevers or chills No malaise No diaphoresis Skin: No rash or itching Eyes: no blurry vision, no double vision GU: no dysuria or hematuria Neuro: no dizziness or headaches all others reviewed and negative   Objective: Vitals:   10/03/17 1515  BP: 118/76  Pulse: (!) 120  Temp: 99.6 F (37.6 C)  TempSrc: Oral  SpO2: 96%  Weight: 215 lb 9.6 oz (97.8 kg)  Height: 5\' 7"  (1.702 m)    Physical Exam General: alert, oriented, in NAD Head: normocephalic, atraumatic, no sinus tenderness Eyes: EOM intact, no scleral icterus or conjunctival injection Ears: TM clear bilaterally Nose: mucosa  nonerythematous, nonedematous Throat: no pharyngeal exudate or erythema Lymph: no posterior auricular, submental or cervical lymph adenopathy Heart: normal rate, normal sinus rhythm, no murmurs Lungs: wheezing in the anterior lung fields   Assessment and Plan Rebecca Grant was seen today for bronchitis (cough).  Diagnoses and all orders for this visit:  Acute bronchitis with bronchospasm- use your flonase and albuterol prn cough Due to wheezing and elevated temps at home will treat with zpak Work note given excusing pt on 10/04/2017. -     azithromycin (ZITHROMAX) 250 MG tablet; Take 2 tablets on day 1 then one tablet each day after     Rebecca Grant

## 2017-11-15 ENCOUNTER — Other Ambulatory Visit: Payer: Self-pay | Admitting: Physician Assistant

## 2017-11-15 DIAGNOSIS — G4700 Insomnia, unspecified: Secondary | ICD-10-CM

## 2017-11-16 NOTE — Telephone Encounter (Signed)
Please advise 

## 2017-11-16 NOTE — Telephone Encounter (Signed)
Request for Lorazepam refill Last OV: 07/23/17 Last Refill:09/21/17 #60 no refill Pharmacy:[facility] [address] [facility] [place] [address]

## 2017-11-16 NOTE — Telephone Encounter (Signed)
Rx sent electronically.  Meds ordered this encounter  Medications  . LORazepam (ATIVAN) 1 MG tablet    Sig: TAKE 1 TABLET BY MOUTH TWICE DAILY AS NEEDED FOR ANXIETY OR SLEEP    Dispense:  60 tablet    Refill:  0    

## 2018-01-03 ENCOUNTER — Other Ambulatory Visit: Payer: Self-pay | Admitting: Physician Assistant

## 2018-01-03 DIAGNOSIS — G4700 Insomnia, unspecified: Secondary | ICD-10-CM

## 2018-01-03 NOTE — Telephone Encounter (Signed)
Lorazepam refill Last OV: 07/23/17 Last Refill:11/16/17 Pharmacy:[facility] [address] PCP: Porfirio Oarhelle Jeffery PA

## 2018-01-03 NOTE — Telephone Encounter (Signed)
Chelle, please advise. Dgaddy, CMA

## 2018-01-19 ENCOUNTER — Encounter: Payer: Self-pay | Admitting: Physician Assistant

## 2018-03-18 ENCOUNTER — Other Ambulatory Visit: Payer: Self-pay | Admitting: Physician Assistant

## 2018-03-18 DIAGNOSIS — G4700 Insomnia, unspecified: Secondary | ICD-10-CM

## 2018-03-20 NOTE — Telephone Encounter (Signed)
Refill request lorazepam 1 mg  LOV 10/03/2017 Rebecca Grant   Last filled  01/04/2018   60 tabs  1  Tab  Bid Theora GianottiJeffrey Chelle  Pharmacy on file

## 2018-03-29 NOTE — Telephone Encounter (Signed)
Rx sent electronically.  Meds ordered this encounter  Medications  . LORazepam (ATIVAN) 1 MG tablet    Sig: TAKE 1 TABLET BY MOUTH TWICE DAILY AS NEEDED FOR ANXIETY OR SLEEP    Dispense:  60 tablet    Refill:  0   Please let her know that she needs to see me or an alternate provider for the next prescription.

## 2018-03-29 NOTE — Telephone Encounter (Signed)
Patient is requesting a refill of the following medications: Requested Prescriptions   Pending Prescriptions Disp Refills  . LORazepam (ATIVAN) 1 MG tablet [Pharmacy Med Name: LORAZEPAM 1MG  TABLETS] 60 tablet 0    Sig: TAKE 1 TABLET BY MOUTH TWICE DAILY AS NEEDED FOR ANXIETY OR SLEEP    Date of patient request: 03/18/2018 Last office visit: 07/23/2017 Date of last refill: 01/04/18 Last refill amount: 60 tabs  Follow up time period per chart: Patient was supposed to return for Wellness exam in a few months after she was seen on 10/6, has not come back for this yet. No future appointments.

## 2019-05-29 ENCOUNTER — Encounter: Payer: Self-pay | Admitting: Registered Nurse

## 2019-05-29 ENCOUNTER — Ambulatory Visit (INDEPENDENT_AMBULATORY_CARE_PROVIDER_SITE_OTHER): Payer: BC Managed Care – PPO | Admitting: Registered Nurse

## 2019-05-29 ENCOUNTER — Other Ambulatory Visit: Payer: Self-pay

## 2019-05-29 VITALS — BP 120/83 | HR 87 | Temp 98.3°F | Resp 16 | Ht 66.93 in | Wt 237.0 lb

## 2019-05-29 DIAGNOSIS — G4700 Insomnia, unspecified: Secondary | ICD-10-CM

## 2019-05-29 DIAGNOSIS — B354 Tinea corporis: Secondary | ICD-10-CM | POA: Diagnosis not present

## 2019-05-29 MED ORDER — FLUCONAZOLE 150 MG PO TABS
150.0000 mg | ORAL_TABLET | Freq: Once | ORAL | 0 refills | Status: AC
Start: 2019-05-29 — End: 2019-05-29

## 2019-05-29 MED ORDER — TRAZODONE HCL 50 MG PO TABS: 25.0000 mg | ORAL_TABLET | Freq: Every evening | ORAL | 3 refills | Status: DC | PRN

## 2019-05-29 NOTE — Progress Notes (Signed)
Established Patient Office Visit  Subjective:  Patient ID: Rebecca Grant, female    DOB: 01/27/1966  Age: 53 y.o. MRN: [id number]  CC:  Chief Complaint  Patient presents with  . Establish Care    need new pcp to manage medications for sleep and nerve pain  . Rash    up unders her breast     HPI [facility]  Complains today of tinea corporis and insomnia.  In the past has been treated for tinea with oral medications. Will give fluconazole today - 150mg  PO once weekly as needed for four weeks. Reviewed nonpharm.   Insomnia - has racing thoughts and trouble falling asleep. Has tried [place] and liked - though unfortunately this was not a prescription given to her. We will trial trazodone.   Past Medical History:  Diagnosis Date  . Anxiety   . Back ache   . Endometriosis    s/p hysterectomy  . Insomnia     Past Surgical History:  Procedure Laterality Date  . ABDOMINAL HYSTERECTOMY    . APPENDECTOMY      Family History  Problem Relation Age of Onset  . Hypertension Mother   . Gout Maternal Grandmother   . Heart disease Maternal Grandfather   . Hypertension Maternal Grandfather   . Gout Maternal Grandfather   . Seizures Sister [id number]       undetermined etiology    Social History   Socioeconomic History  . Marital status: Divorced    Spouse name: n/a  . Number of children: 1  . Years of education: 3212  . Highest education level: Not on file  Occupational History  . Occupation: Control and instrumentation engineerassistant store manager    Employer: UNEMPLOYED  Social Needs  . Financial resource strain: Not hard at all  . Food insecurity    Worry: Never true    Inability: Never true  . Transportation needs    Medical: No    Non-medical: No  Tobacco Use  . Smoking status: Former Smoker    Years: 6.00    Types: Cigarettes  . Smokeless tobacco: Never Used  . Tobacco comment: smokes for stress relief, down to 1 cigarette daily  Substance and Sexual Activity  . Alcohol  use: No    Alcohol/week: 0.0 standard drinks  . Drug use: No  . Sexual activity: Never    Birth control/protection: Abstinence, Surgical  Lifestyle  . Physical activity    Days per week: 0 days    Minutes per session: 0 min  . Stress: Not at all  Relationships  . Social Musicianconnections    Talks on phone: Three times a week    Gets together: Twice a week    Attends religious service: Patient refused    Active member of club or organization: Patient refused    Attends meetings of clubs or organizations: Patient refused    Relationship status: Patient refused  . Intimate partner violence    Fear of current or ex partner: No    Emotionally abused: No    Physically abused: No    Forced sexual activity: No  Other Topics Concern  . Not on file  Social History Narrative   Lives alone.    Outpatient Medications Prior to Visit  Medication Sig Dispense Refill  . albuterol (PROVENTIL HFA;VENTOLIN HFA) 108 (90 Base) MCG/ACT inhaler Inhale 2 puffs into the lungs every 6 (six) hours as needed. 1 Inhaler 1  . cetirizine (ZYRTEC) 10 MG tablet  Take 1 tablet (10 mg total) by mouth daily. 30 tablet 11  . fluticasone (FLONASE) 50 MCG/ACT nasal spray Place 2 sprays into both nostrils daily. 48 g 3  . LORazepam (ATIVAN) 1 MG tablet TAKE 1 TABLET BY MOUTH TWICE DAILY AS NEEDED FOR ANXIETY OR SLEEP 60 tablet 0  . azelastine (ASTELIN) 0.1 % nasal spray Place 2 sprays into both nostrils 2 (two) times daily. Use in each nostril as directed 30 mL PRN  . clobetasol cream (TEMOVATE) 0.05 % APPLY TOPICALLY TWICE DAILY AS NEEDED 30 g 0  . guaiFENesin-codeine 100-10 MG/5ML syrup Take 5 mLs by mouth at bedtime. 120 mL 0   No facility-administered medications prior to visit.     Not on File  ROS Review of Systems  Constitutional: Negative.   HENT: Negative.   Eyes: Negative.   Respiratory: Negative.   Cardiovascular: Negative.   Gastrointestinal: Negative.   Endocrine: Negative.   Genitourinary:  Negative.   Musculoskeletal: Negative.   Skin: Positive for rash.  Allergic/Immunologic: Negative.   Neurological: Negative.   Hematological: Negative.   Psychiatric/Behavioral: Positive for sleep disturbance.  All other systems reviewed and are negative.     Objective:    Physical Exam  Constitutional: She is oriented to person, place, and time. She appears well-developed and well-nourished. No distress.  Cardiovascular: Normal rate and regular rhythm.  Pulmonary/Chest: Effort normal. No respiratory distress.  Abdominal: Soft.  Neurological: She is alert and oriented to person, place, and time.  Skin: Skin is warm and dry. Rash noted. She is not diaphoretic. No erythema. No pallor.  Psychiatric: She has a normal mood and affect. Her behavior is normal. Judgment and thought content normal.  Nursing note and vitals reviewed.   BP 120/83   Pulse 87   Temp 98.3 F (36.8 C) (Oral)   Resp 16   Ht 5' 6.93" (1.7 m)   Wt 237 lb (107.5 kg)   SpO2 97%   BMI 37.20 kg/m  Wt Readings from Last 3 Encounters:  05/29/19 237 lb (107.5 kg)  10/03/17 215 lb 9.6 oz (97.8 kg)  09/07/17 214 lb 12.8 oz (97.4 kg)     Health Maintenance Due  Topic Date Due  . HIV Screening  03/26/1983  . PAP SMEAR-Modifier  03/25/1989  . MAMMOGRAM  03/25/2018  . COLONOSCOPY  03/25/2018  . INFLUENZA VACCINE  05/19/2019    There are no preventive care reminders to display for this patient.  Lab Results  Component Value Date   TSH 0.549 03/19/2014   Lab Results  Component Value Date   WBC 9.1 07/11/2017   HGB 15.2 (H) 07/11/2017   HCT 45.2 07/11/2017   MCV 96.2 07/11/2017   PLT 221 07/11/2017   Lab Results  Component Value Date   NA 140 07/11/2017   K 3.7 07/11/2017   CO2 22 07/11/2017   GLUCOSE 152 (H) 07/11/2017   BUN 10 07/11/2017   CREATININE 0.80 07/11/2017   BILITOT 0.4 03/19/2014   ALKPHOS 69 03/19/2014   AST 18 03/19/2014   ALT 19 03/19/2014   PROT 6.8 03/19/2014   ALBUMIN 4.3  03/19/2014   CALCIUM 8.9 07/11/2017   ANIONGAP 7 07/11/2017   Lab Results  Component Value Date   CHOL 143 03/19/2014   Lab Results  Component Value Date   HDL 26 (L) 03/19/2014   Lab Results  Component Value Date   LDLCALC 103 (H) 03/19/2014   Lab Results  Component Value Date   TRIG  72 03/19/2014   Lab Results  Component Value Date   CHOLHDL 5.5 03/19/2014   No results found for: HGBA1C    Assessment & Plan:   Problem List Items Addressed This Visit      Other   Insomnia   Relevant Medications   traZODone (DESYREL) 50 MG tablet    Other Visit Diagnoses    Tinea corporis    -  Primary   Relevant Medications   fluconazole (DIFLUCAN) 150 MG tablet      Meds ordered this encounter  Medications  . fluconazole (DIFLUCAN) 150 MG tablet    Sig: Take 1 tablet (150 mg total) by mouth once for 1 dose. Repeat weekly as needed.    Dispense:  4 tablet    Refill:  0    Order Specific Question:   Supervising Provider    Answer:   Delia Chimes A [id number]  . traZODone (DESYREL) 50 MG tablet    Sig: Take 0.5-1 tablets (25-50 mg total) by mouth at bedtime as needed for sleep.    Dispense:  30 tablet    Refill:  3    Order Specific Question:   Supervising Provider    Answer:   Forrest Moron [id number]    Follow-up: No follow-ups on file.    Maximiano Coss, NP

## 2019-05-29 NOTE — Patient Instructions (Signed)
° ° ° °  If you have lab work done today you will be contacted with your lab results within the next 2 weeks.  If you have not heard from us then please contact us. The fastest way to get your results is to register for My Chart. ° ° °IF you received an x-ray today, you will receive an invoice from [facility]. Please contact [facility] at [phone] with questions or concerns regarding your invoice.  ° °IF you received labwork today, you will receive an invoice from [facility]. Please contact [facility] at [phone] with questions or concerns regarding your invoice.  ° °Our billing staff will not be able to assist you with questions regarding bills from these companies. ° °You will be contacted with the lab results as soon as they are available. The fastest way to get your results is to activate your My Chart account. Instructions are located on the last page of this paperwork. If you have not heard from us regarding the results in 2 weeks, please contact this office. °  ° ° ° °

## 2019-05-30 ENCOUNTER — Telehealth: Payer: Self-pay | Admitting: Registered Nurse

## 2019-05-30 ENCOUNTER — Other Ambulatory Visit: Payer: Self-pay | Admitting: Registered Nurse

## 2019-05-30 DIAGNOSIS — M5430 Sciatica, unspecified side: Secondary | ICD-10-CM

## 2019-05-30 MED ORDER — GABAPENTIN 100 MG PO CAPS: 100.0000 mg | ORAL_CAPSULE | Freq: Three times a day (TID) | ORAL | 0 refills | Status: DC

## 2019-05-30 NOTE — Telephone Encounter (Signed)
Pt says that you were supposed to send gabapentin for her sciatica to the pharmacy

## 2019-05-30 NOTE — Telephone Encounter (Signed)
°  Relation to pt: self  Call back number: [phone]  Pharmacy: [facility] [place] [phone] (Phone) [phone] (Fax)    Reason for call:  Patient was seen 05/29/2019 by PCP, patient states PCP was suppose to prescribe antibiotic and gabapentin 100 mg for her sciatica, patient would like Rx sent today, please advise

## 2019-05-30 NOTE — Telephone Encounter (Signed)
Please see patients request.  Patient had an OV on 05/29/2019 and was supposed to get medications, however no meds were sent to the pharmacy / Please have provider review and send medications if applicable.

## 2019-06-27 ENCOUNTER — Encounter: Payer: Self-pay | Admitting: Registered Nurse

## 2019-06-27 ENCOUNTER — Other Ambulatory Visit: Payer: Self-pay | Admitting: Registered Nurse

## 2019-06-27 DIAGNOSIS — M5430 Sciatica, unspecified side: Secondary | ICD-10-CM

## 2019-06-29 ENCOUNTER — Ambulatory Visit: Payer: BC Managed Care – PPO | Admitting: Registered Nurse

## 2019-09-04 ENCOUNTER — Encounter: Payer: Self-pay | Admitting: Registered Nurse

## 2019-09-04 ENCOUNTER — Other Ambulatory Visit: Payer: Self-pay

## 2019-09-04 ENCOUNTER — Ambulatory Visit (INDEPENDENT_AMBULATORY_CARE_PROVIDER_SITE_OTHER): Payer: BC Managed Care – PPO

## 2019-09-04 ENCOUNTER — Ambulatory Visit: Payer: BC Managed Care – PPO | Admitting: Registered Nurse

## 2019-09-04 VITALS — BP 116/78 | HR 78 | Temp 98.0°F | Resp 18 | Wt 242.0 lb

## 2019-09-04 DIAGNOSIS — M5430 Sciatica, unspecified side: Secondary | ICD-10-CM | POA: Diagnosis not present

## 2019-09-04 DIAGNOSIS — M25562 Pain in left knee: Secondary | ICD-10-CM

## 2019-09-04 MED ORDER — CYCLOBENZAPRINE HCL 10 MG PO TABS: 10.0000 mg | ORAL_TABLET | Freq: Three times a day (TID) | ORAL | 0 refills | Status: AC | PRN

## 2019-09-04 MED ORDER — MELOXICAM 15 MG PO TABS: 15.0000 mg | ORAL_TABLET | Freq: Every day | ORAL | 0 refills | Status: DC

## 2019-09-04 NOTE — Patient Instructions (Signed)
° ° ° °  If you have lab work done today you will be contacted with your lab results within the next 2 weeks.  If you have not heard from us then please contact us. The fastest way to get your results is to register for My Chart. ° ° °IF you received an x-ray today, you will receive an invoice from [facility]. Please contact [facility] at [phone] with questions or concerns regarding your invoice.  ° °IF you received labwork today, you will receive an invoice from [facility]. Please contact [facility] at [phone] with questions or concerns regarding your invoice.  ° °Our billing staff will not be able to assist you with questions regarding bills from these companies. ° °You will be contacted with the lab results as soon as they are available. The fastest way to get your results is to activate your My Chart account. Instructions are located on the last page of this paperwork. If you have not heard from us regarding the results in 2 weeks, please contact this office. °  ° ° ° °

## 2019-09-09 NOTE — Progress Notes (Signed)
Acute Office Visit  Subjective:    Patient ID: Rebecca Grant, female    DOB: 16-Feb-1966, 53 y.o.   MRN: [id number]  Chief Complaint  Patient presents with  . Knee Pain    left knee  . Nerve Pain    left side all way down left leg, pt states the Gabapentin is not working for the pain     HPI Patient is in today for ongoing knee pain.   She presented last time with concerns for sciatic type pain - She spends much of her work days on her feet, and stated she had pain radiating down her L leg originating from her pack. We decided to try stretching and low dose gabapentin. However, this was not effective.  Presents today with a greater focus on pain in her L knee. Much of the joint feels swollen and tender. She has not fallen but notes that it feels less stable. Giving her pain at work, stiffness after work. No numbness or weakness in L lower leg.   Past Medical History:  Diagnosis Date  . Anxiety   . Back ache   . Endometriosis    s/p hysterectomy  . Insomnia     Past Surgical History:  Procedure Laterality Date  . ABDOMINAL HYSTERECTOMY    . APPENDECTOMY      Family History  Problem Relation Age of Onset  . Hypertension Mother   . Gout Maternal Grandmother   . Heart disease Maternal Grandfather   . Hypertension Maternal Grandfather   . Gout Maternal Grandfather   . Seizures Sister 49       undetermined etiology    Social History   Socioeconomic History  . Marital status: Divorced    Spouse name: n/a  . Number of children: 1  . Years of education: 33  . Highest education level: Not on file  Occupational History  . Occupation: Control and instrumentation engineer: UNEMPLOYED  Social Needs  . Financial resource strain: Not hard at all  . Food insecurity    Worry: Never true    Inability: Never true  . Transportation needs    Medical: No    Non-medical: No  Tobacco Use  . Smoking status: Former Smoker    Years: 6.00    Types: Cigarettes  . Smokeless  tobacco: Never Used  . Tobacco comment: smokes for stress relief, down to 1 cigarette daily  Substance and Sexual Activity  . Alcohol use: No    Alcohol/week: 0.0 standard drinks  . Drug use: No  . Sexual activity: Never    Birth control/protection: Abstinence, Surgical  Lifestyle  . Physical activity    Days per week: 0 days    Minutes per session: 0 min  . Stress: Not at all  Relationships  . Social Musician on phone: Three times a week    Gets together: Twice a week    Attends religious service: Patient refused    Active member of club or organization: Patient refused    Attends meetings of clubs or organizations: Patient refused    Relationship status: Patient refused  . Intimate partner violence    Fear of current or ex partner: No    Emotionally abused: No    Physically abused: No    Forced sexual activity: No  Other Topics Concern  . Not on file  Social History Narrative   Lives alone.    Outpatient Medications Prior to Visit  Medication Sig Dispense Refill  . albuterol (PROVENTIL HFA;VENTOLIN HFA) 108 (90 Base) MCG/ACT inhaler Inhale 2 puffs into the lungs every 6 (six) hours as needed. 1 Inhaler 1  . cetirizine (ZYRTEC) 10 MG tablet Take 1 tablet (10 mg total) by mouth daily. 30 tablet 11  . fluticasone (FLONASE) 50 MCG/ACT nasal spray Place 2 sprays into both nostrils daily. 48 g 3  . gabapentin (NEURONTIN) 100 MG capsule TAKE 1 CAPSULE(100 MG) BY MOUTH THREE TIMES DAILY 180 capsule 1  . traZODone (DESYREL) 50 MG tablet Take 0.5-1 tablets (25-50 mg total) by mouth at bedtime as needed for sleep. 30 tablet 3  . LORazepam (ATIVAN) 1 MG tablet TAKE 1 TABLET BY MOUTH TWICE DAILY AS NEEDED FOR ANXIETY OR SLEEP 60 tablet 0   No facility-administered medications prior to visit.     Not on File  Review of Systems  Constitutional: Negative.   HENT: Negative.   Eyes: Negative.   Respiratory: Negative.   Cardiovascular: Negative.   Gastrointestinal:  Negative.   Genitourinary: Negative.   Musculoskeletal: Positive for back pain, joint pain and myalgias. Negative for falls and neck pain.  Skin: Negative.   Neurological: Negative.  Negative for tingling and weakness.  Endo/Heme/Allergies: Negative.   Psychiatric/Behavioral: Negative.   All other systems reviewed and are negative.      Objective:    Physical Exam  Constitutional: She is oriented to person, place, and time. She appears well-developed and well-nourished. No distress.  Cardiovascular: Normal rate and regular rhythm.  Pulmonary/Chest: Effort normal. No respiratory distress.  Musculoskeletal: Normal range of motion.        General: Tenderness (l knee) and edema (l knee) present.  Neurological: She is alert and oriented to person, place, and time.  Skin: Skin is warm and dry. No rash noted. She is not diaphoretic. No erythema. No pallor.  Psychiatric: She has a normal mood and affect. Her behavior is normal. Judgment and thought content normal.  Nursing note and vitals reviewed. negative anterior drawer  BP 116/78   Pulse 78   Temp 98 F (36.7 C) (Oral)   Resp 18   Wt 242 lb (109.8 kg)   SpO2 98%   BMI 37.98 kg/m  Wt Readings from Last 3 Encounters:  09/04/19 242 lb (109.8 kg)  05/29/19 237 lb (107.5 kg)  10/03/17 215 lb 9.6 oz (97.8 kg)    Health Maintenance Due  Topic Date Due  . HIV Screening  03/26/1983  . PAP SMEAR-Modifier  03/25/1989  . MAMMOGRAM  03/25/2018  . COLONOSCOPY  03/25/2018    There are no preventive care reminders to display for this patient.   Lab Results  Component Value Date   TSH 0.549 03/19/2014   Lab Results  Component Value Date   WBC 9.1 07/11/2017   HGB 15.2 (H) 07/11/2017   HCT 45.2 07/11/2017   MCV 96.2 07/11/2017   PLT 221 07/11/2017   Lab Results  Component Value Date   NA 140 07/11/2017   K 3.7 07/11/2017   CO2 22 07/11/2017   GLUCOSE 152 (H) 07/11/2017   BUN 10 07/11/2017   CREATININE 0.80 07/11/2017    BILITOT 0.4 03/19/2014   ALKPHOS 69 03/19/2014   AST 18 03/19/2014   ALT 19 03/19/2014   PROT 6.8 03/19/2014   ALBUMIN 4.3 03/19/2014   CALCIUM 8.9 07/11/2017   ANIONGAP 7 07/11/2017   Lab Results  Component Value Date   CHOL 143 03/19/2014   Lab Results  Component Value Date   HDL 26 (L) 03/19/2014   Lab Results  Component Value Date   LDLCALC 103 (H) 03/19/2014   Lab Results  Component Value Date   TRIG 72 03/19/2014   Lab Results  Component Value Date   CHOLHDL 5.5 03/19/2014   No results found for: HGBA1C     Assessment & Plan:   Problem List Items Addressed This Visit      Nervous and Auditory   Sciatica   Relevant Medications   meloxicam (MOBIC) 15 MG tablet   cyclobenzaprine (FLEXERIL) 10 MG tablet   Other Relevant Orders   Ambulatory referral to Physical Therapy    Other Visit Diagnoses    Acute pain of left knee    -  Primary   Relevant Medications   meloxicam (MOBIC) 15 MG tablet   cyclobenzaprine (FLEXERIL) 10 MG tablet   Other Relevant Orders   DG Knee Complete 4 Views Left (Completed)   Ambulatory referral to Physical Therapy   Ambulatory referral to Orthopedic Surgery       Meds ordered this encounter  Medications  . meloxicam (MOBIC) 15 MG tablet    Sig: Take 1 tablet (15 mg total) by mouth daily.    Dispense:  30 tablet    Refill:  0    Order Specific Question:   Supervising Provider    Answer:   Delia Chimes A [id number]  . cyclobenzaprine (FLEXERIL) 10 MG tablet    Sig: Take 1 tablet (10 mg total) by mouth 3 (three) times daily as needed for muscle spasms.    Dispense:  30 tablet    Refill:  0    Order Specific Question:   Supervising Provider    Answer:   Delia Chimes A [id number]   PLAN  Meloxicam and cyclobenzaprine for pain and discomfort.   Xray reveals deep notch sign on femur, commonly associated with ACL injury. Will refer to PT and Orthopedic Surgery for further assessment and management  Patient encouraged  to call clinic with any questions, comments, or concerns.    Maximiano Coss, NP

## 2019-09-11 ENCOUNTER — Encounter: Payer: Self-pay | Admitting: Orthopaedic Surgery

## 2019-09-11 ENCOUNTER — Other Ambulatory Visit: Payer: Self-pay

## 2019-09-11 ENCOUNTER — Ambulatory Visit (INDEPENDENT_AMBULATORY_CARE_PROVIDER_SITE_OTHER): Payer: BC Managed Care – PPO | Admitting: Orthopaedic Surgery

## 2019-09-11 DIAGNOSIS — M1712 Unilateral primary osteoarthritis, left knee: Secondary | ICD-10-CM | POA: Diagnosis not present

## 2019-09-11 MED ORDER — METHYLPREDNISOLONE ACETATE 40 MG/ML IJ SUSP
40.0000 mg | INTRAMUSCULAR | Status: AC | PRN
  Administered 2019-09-11: 40 mg via INTRA_ARTICULAR

## 2019-09-11 MED ORDER — LIDOCAINE HCL 1 % IJ SOLN
2.0000 mL | INTRAMUSCULAR | Status: AC | PRN
  Administered 2019-09-11: 2 mL

## 2019-09-11 MED ORDER — BUPIVACAINE HCL 0.25 % IJ SOLN
2.0000 mL | INTRAMUSCULAR | Status: AC | PRN
  Administered 2019-09-11: 2 mL via INTRA_ARTICULAR

## 2019-09-11 NOTE — Progress Notes (Signed)
Office Visit Note   Patient: Rebecca Grant           Date of Birth: 03-04-1966           MRN: [id number] Visit Date: 09/11/2019              Requested by: Janeece Agee, NP [address] [place],  [place] [address] PCP: Janeece Agee, NP   Assessment & Plan: Visit Diagnoses:  1. Unilateral primary osteoarthritis, left knee     Plan: Impression is left knee arthritis flareup.  We will inject the left knee with cortisone today.  She will give this at least 2 weeks to notice improvement of symptoms.  If she has continued pain she will let us know and we will order an MRI to assess for structural abnormalities.  Follow-Up Instructions: Return if symptoms worsen or fail to improve.   Orders:  Orders Placed This Encounter  Procedures  . Large Joint Inj: L knee   No orders of the defined types were placed in this encounter.     Procedures: Large Joint Inj: L knee on 09/11/2019 3:13 PM Indications: pain Details: 22 G needle, anterolateral approach Medications: 2 mL bupivacaine 0.25 %; 2 mL lidocaine 1 %; 40 mg methylPREDNISolone acetate 40 MG/ML      Clinical Data: No additional findings.   Subjective: Chief Complaint  Patient presents with  . Left Knee - Pain    HPI patient is a pleasant 53 year old [employer] employee who presents our clinic today with left knee pain.  She has had intermittent pain to the left knee for the past several months but has been more constant over the past month.  No known injury or change in activity.  The majority of her pain is to the lateral aspect.  She describes this as a constant ache.  No mechanical symptoms.  She does have increased pain going from a seated to standing position as well as at the end of the day after she has been on her feet working.  She has been taking Mobic without significant relief of symptoms.  She does have a history of left lower extremity sciatica for which she is taking Flexeril and gabapentin.  No previous left  knee injection or surgical intervention.  Review of Systems as detailed in HPI.  All others reviewed and are negative.   Objective: Vital Signs: There were no vitals taken for this visit.  Physical Exam well developed well-nourished female no acute distress.  Alert and oriented x3. Ortho Exam examination of the left knee shows a trace effusion.  Range of motion 0 to 110 degrees.  Moderate patellofemoral crepitus.  Medial and lateral joint line tenderness.  Ligaments are stable.  She is neurovascularly intact distally.  Specialty Comments:  No specialty comments available.  Imaging: Previous x-rays in canopy reviewed by me show mild joint space narrowing medial and patellofemoral compartments.  There is a questionable deep femoral notch which could indicate an osteochondral lesion that was noted by the radiologist.   PMFS History: Patient Active Problem List   Diagnosis Date Noted  . H/O: hysterectomy 06/03/2016  . Sciatica 06/03/2016  . Nocturia 03/19/2014  . Obesity (BMI 30-39.9) 03/19/2014  . Anxiety 03/08/2013  . Insomnia    Past Medical History:  Diagnosis Date  . Anxiety   . Back ache   . Endometriosis    s/p hysterectomy  . Insomnia     Family History  Problem Relation Age of Onset  .  Hypertension Mother   . Gout Maternal Grandmother   . Heart disease Maternal Grandfather   . Hypertension Maternal Grandfather   . Gout Maternal Grandfather   . Seizures Sister 33       undetermined etiology    Past Surgical History:  Procedure Laterality Date  . ABDOMINAL HYSTERECTOMY    . APPENDECTOMY     Social History   Occupational History  . Occupation: Camera operator: UNEMPLOYED  Tobacco Use  . Smoking status: Former Smoker    Years: 6.00    Types: Cigarettes  . Smokeless tobacco: Never Used  . Tobacco comment: smokes for stress relief, down to 1 cigarette daily  Substance and Sexual Activity  . Alcohol use: No    Alcohol/week: 0.0  standard drinks  . Drug use: No  . Sexual activity: Never    Birth control/protection: Abstinence, Surgical

## 2019-09-17 ENCOUNTER — Other Ambulatory Visit: Payer: Self-pay | Admitting: Registered Nurse

## 2019-09-17 DIAGNOSIS — G4700 Insomnia, unspecified: Secondary | ICD-10-CM

## 2019-09-25 ENCOUNTER — Encounter: Payer: Self-pay | Admitting: Orthopaedic Surgery

## 2019-09-25 ENCOUNTER — Ambulatory Visit: Payer: BLUE CROSS/BLUE SHIELD | Admitting: Physical Therapy

## 2019-09-30 ENCOUNTER — Encounter: Payer: Self-pay | Admitting: Orthopaedic Surgery

## 2019-10-01 ENCOUNTER — Other Ambulatory Visit: Payer: Self-pay | Admitting: Registered Nurse

## 2019-10-01 DIAGNOSIS — M25562 Pain in left knee: Secondary | ICD-10-CM

## 2019-10-01 DIAGNOSIS — M5430 Sciatica, unspecified side: Secondary | ICD-10-CM

## 2019-10-01 NOTE — Telephone Encounter (Signed)
Requested medication (s) are due for refill today: yes  Requested medication (s) are on the active medication list: yes  Last refill:  09/04/2019  Future visit scheduled:no  Notes to clinic:  Review to see if patient needs to continue medication   Requested Prescriptions  Pending Prescriptions Disp Refills   meloxicam (MOBIC) 15 MG tablet [Pharmacy Med Name: MELOXICAM 15MG  TABLETS] 30 tablet 0    Sig: TAKE 1 TABLET(15 MG) BY MOUTH DAILY      Analgesics:  COX2 Inhibitors Failed - 10/01/2019  3:46 AM      Failed - HGB in normal range and within 360 days    Hemoglobin  Date Value Ref Range Status  07/11/2017 15.2 (H) 12.0 - 15.0 g/dL Final          Failed - Cr in normal range and within 360 days    Creat  Date Value Ref Range Status  03/19/2014 0.76 0.50 - 1.10 mg/dL Final   Creatinine, Ser  Date Value Ref Range Status  07/11/2017 0.80 0.44 - 1.00 mg/dL Final          Passed - Patient is not pregnant      Passed - Valid encounter within last 12 months    Recent Outpatient Visits           3 weeks ago Acute pain of left knee   [facility], Delfino Lovett, NP   4 months ago Tinea corporis   [facility], [place], NP   1 year ago Acute bronchitis with bronchospasm   [facility], Arlie Solomons, MD   2 years ago Shoulder mass   [facility], [place], [place]   2 years ago Insomnia, unspecified type   [facility] at [facility], [place], [place]

## 2019-10-21 ENCOUNTER — Encounter: Payer: Self-pay | Admitting: Registered Nurse

## 2019-10-23 ENCOUNTER — Ambulatory Visit: Payer: BC Managed Care – PPO | Admitting: Registered Nurse

## 2019-10-23 ENCOUNTER — Encounter: Payer: Self-pay | Admitting: Registered Nurse

## 2019-10-23 ENCOUNTER — Other Ambulatory Visit: Payer: Self-pay

## 2019-10-23 VITALS — BP 131/86 | HR 94 | Temp 97.5°F | Resp 16 | Ht 66.0 in | Wt 238.4 lb

## 2019-10-23 DIAGNOSIS — G4733 Obstructive sleep apnea (adult) (pediatric): Secondary | ICD-10-CM

## 2019-10-23 DIAGNOSIS — K219 Gastro-esophageal reflux disease without esophagitis: Secondary | ICD-10-CM

## 2019-10-23 DIAGNOSIS — J309 Allergic rhinitis, unspecified: Secondary | ICD-10-CM | POA: Diagnosis not present

## 2019-10-23 MED ORDER — FLUTICASONE PROPIONATE 50 MCG/ACT NA SUSP: 2.0000 | Freq: Every day | NASAL | 3 refills | Status: AC

## 2019-10-23 MED ORDER — ESOMEPRAZOLE MAGNESIUM 40 MG PO CPDR: 40.0000 mg | DELAYED_RELEASE_CAPSULE | Freq: Every day | ORAL | 3 refills | Status: DC

## 2019-10-23 NOTE — Patient Instructions (Signed)
° ° ° °  If you have lab work done today you will be contacted with your lab results within the next 2 weeks.  If you have not heard from us then please contact us. The fastest way to get your results is to register for My Chart. ° ° °IF you received an x-ray today, you will receive an invoice from [facility]. Please contact  Radiology at [phone] with questions or concerns regarding your invoice.  ° °IF you received labwork today, you will receive an invoice from [facility]. Please contact [facility] at [phone] with questions or concerns regarding your invoice.  ° °Our billing staff will not be able to assist you with questions regarding bills from these companies. ° °You will be contacted with the lab results as soon as they are available. The fastest way to get your results is to activate your My Chart account. Instructions are located on the last page of this paperwork. If you have not heard from us regarding the results in 2 weeks, please contact this office. °  ° ° ° °

## 2019-10-23 NOTE — Progress Notes (Signed)
Acute Office Visit  Subjective:    Patient ID: Rebecca Grant, female    DOB: 15-Aug-1966, 54 y.o.   MRN: [id number]  Chief Complaint  Patient presents with  . Insomnia    patient states she was sleep two night ago she was sleep and her eyes popped opened and had no air. patient states this is the 2nd time it happened but didnt have to vomit when catching her breath  . Insomnia    HPI Patient is in today for trouble sleeping   Insomnia has been a problem for her in the past. We have started her on trazodone to help her fall asleep  However, she is noticing instances where she will wake up gasping for breath with horrible reflux. This is happening with increasing frequency. She notes that she has snored for a while but worse lately. Wakes frequently during the night. Denies restful sleep. Sleepy during the day. Wakes with headaches during the night and in the morning.  Reports one time the reflux and gasping was substantial enough to warrant her vomiting.    Past Medical History:  Diagnosis Date  . Anxiety   . Back ache   . Endometriosis    s/p hysterectomy  . Insomnia     Past Surgical History:  Procedure Laterality Date  . ABDOMINAL HYSTERECTOMY    . APPENDECTOMY      Family History  Problem Relation Age of Onset  . Hypertension Mother   . Gout Maternal Grandmother   . Heart disease Maternal Grandfather   . Hypertension Maternal Grandfather   . Gout Maternal Grandfather   . Seizures Sister 52       undetermined etiology    Social History   Socioeconomic History  . Marital status: Divorced    Spouse name: n/a  . Number of children: 1  . Years of education: 28  . Highest education level: Not on file  Occupational History  . Occupation: Control and instrumentation engineer: UNEMPLOYED  Tobacco Use  . Smoking status: Former Smoker    Years: 6.00    Types: Cigarettes  . Smokeless tobacco: Never Used  . Tobacco comment: smokes for stress relief, down to 1  cigarette daily  Substance and Sexual Activity  . Alcohol use: No    Alcohol/week: 0.0 standard drinks  . Drug use: No  . Sexual activity: Never    Birth control/protection: Abstinence, Surgical  Other Topics Concern  . Not on file  Social History Narrative   Lives alone.   Social Determinants of Health   Financial Resource Strain: Low Risk   . Difficulty of Paying Living Expenses: Not hard at all  Food Insecurity: No Food Insecurity  . Worried About Programme researcher, broadcasting/film/video in the Last Year: Never true  . Ran Out of Food in the Last Year: Never true  Transportation Needs: No Transportation Needs  . Lack of Transportation (Medical): No  . Lack of Transportation (Non-Medical): No  Physical Activity: Inactive  . Days of Exercise per Week: 0 days  . Minutes of Exercise per Session: 0 min  Stress: No Stress Concern Present  . Feeling of Stress : Not at all  Social Connections: Unknown  . Frequency of Communication with Friends and Family: Three times a week  . Frequency of Social Gatherings with Friends and Family: Twice a week  . Attends Religious Services: Patient refused  . Active Member of Clubs or Organizations: Patient refused  . Attends Club  or Organization Meetings: Patient refused  . Marital Status: Patient refused  Intimate Partner Violence: Not At Risk  . Fear of Current or Ex-Partner: No  . Emotionally Abused: No  . Physically Abused: No  . Sexually Abused: No    Outpatient Medications Prior to Visit  Medication Sig Dispense Refill  . gabapentin (NEURONTIN) 100 MG capsule TAKE 1 CAPSULE(100 MG) BY MOUTH THREE TIMES DAILY 180 capsule 1  . meloxicam (MOBIC) 15 MG tablet TAKE 1 TABLET(15 MG) BY MOUTH DAILY 30 tablet 0  . fluticasone (FLONASE) 50 MCG/ACT nasal spray Place 2 sprays into both nostrils daily. 48 g 3  . albuterol (PROVENTIL HFA;VENTOLIN HFA) 108 (90 Base) MCG/ACT inhaler Inhale 2 puffs into the lungs every 6 (six) hours as needed. (Patient not taking:  Reported on 10/23/2019) 1 Inhaler 1  . cetirizine (ZYRTEC) 10 MG tablet Take 1 tablet (10 mg total) by mouth daily. (Patient not taking: Reported on 10/23/2019) 30 tablet 11  . cyclobenzaprine (FLEXERIL) 10 MG tablet Take 1 tablet (10 mg total) by mouth 3 (three) times daily as needed for muscle spasms. (Patient not taking: Reported on 10/23/2019) 30 tablet 0  . traZODone (DESYREL) 50 MG tablet TAKE 1/2 TO 1 TABLET(25 TO 50 MG) BY MOUTH AT BEDTIME AS NEEDED FOR SLEEP (Patient not taking: Reported on 10/23/2019) 30 tablet 1   No facility-administered medications prior to visit.    No Known Allergies  Review of Systems Per hpi     Objective:    Physical Exam Vitals and nursing note reviewed.  Constitutional:      General: She is not in acute distress.    Appearance: Normal appearance. She is normal weight. She is not toxic-appearing.  Cardiovascular:     Rate and Rhythm: Normal rate and regular rhythm.  Pulmonary:     Effort: Pulmonary effort is normal. No respiratory distress.  Neurological:     General: No focal deficit present.     Mental Status: She is alert and oriented to person, place, and time. Mental status is at baseline.  Psychiatric:        Mood and Affect: Mood normal.        Behavior: Behavior normal.        Thought Content: Thought content normal.        Judgment: Judgment normal.     BP 131/86   Pulse 94   Temp (!) 97.5 F (36.4 C) (Temporal)   Resp 16   Ht 5\' 6"  (1.676 m)   Wt 238 lb 6.4 oz (108.1 kg)   SpO2 100%   BMI 38.48 kg/m  Wt Readings from Last 3 Encounters:  10/23/19 238 lb 6.4 oz (108.1 kg)  09/04/19 242 lb (109.8 kg)  05/29/19 237 lb (107.5 kg)    There are no preventive care reminders to display for this patient.  There are no preventive care reminders to display for this patient.   Lab Results  Component Value Date   TSH 0.549 03/19/2014   Lab Results  Component Value Date   WBC 9.1 07/11/2017   HGB 15.2 (H) 07/11/2017   HCT 45.2  07/11/2017   MCV 96.2 07/11/2017   PLT 221 07/11/2017   Lab Results  Component Value Date   NA 140 07/11/2017   K 3.7 07/11/2017   CO2 22 07/11/2017   GLUCOSE 152 (H) 07/11/2017   BUN 10 07/11/2017   CREATININE 0.80 07/11/2017   BILITOT 0.4 03/19/2014   ALKPHOS 69 03/19/2014  AST 18 03/19/2014   ALT 19 03/19/2014   PROT 6.8 03/19/2014   ALBUMIN 4.3 03/19/2014   CALCIUM 8.9 07/11/2017   ANIONGAP 7 07/11/2017   Lab Results  Component Value Date   CHOL 143 03/19/2014   Lab Results  Component Value Date   HDL 26 (L) 03/19/2014   Lab Results  Component Value Date   LDLCALC 103 (H) 03/19/2014   Lab Results  Component Value Date   TRIG 72 03/19/2014   Lab Results  Component Value Date   CHOLHDL 5.5 03/19/2014   No results found for: HGBA1C     Assessment & Plan:   Problem List Items Addressed This Visit    None    Visit Diagnoses    Gastroesophageal reflux disease without esophagitis    -  Primary   Relevant Medications   esomeprazole (NEXIUM) 40 MG capsule   Allergic rhinitis, unspecified seasonality, unspecified trigger       Controlled.   Relevant Medications   fluticasone (FLONASE) 50 MCG/ACT nasal spray   OSA (obstructive sleep apnea)       Relevant Orders   Ambulatory referral to Pulmonology       Meds ordered this encounter  Medications  . fluticasone (FLONASE) 50 MCG/ACT nasal spray    Sig: Place 2 sprays into both nostrils daily.    Dispense:  48 g    Refill:  3  . esomeprazole (NEXIUM) 40 MG capsule    Sig: Take 1 capsule (40 mg total) by mouth daily at 12 noon.    Dispense:  30 capsule    Refill:  3    Order Specific Question:   Supervising Provider    Answer:   Doristine Bosworth [id number]    PLAN  Esomeprazole 40mg  PO qd for reflux  Referral to pulmonology for sleep study - patient exhibiting many symptoms of OSA, feel she would benefit from sleep study and therapy to correct her breathing issues while sleeping.  Given nonpharm  to help with sleep including sleep hygiene.   Patient encouraged to call clinic with any questions, comments, or concerns.  , NP

## 2019-11-14 ENCOUNTER — Other Ambulatory Visit: Payer: Self-pay | Admitting: Registered Nurse

## 2019-11-14 DIAGNOSIS — M5430 Sciatica, unspecified side: Secondary | ICD-10-CM

## 2019-11-14 DIAGNOSIS — M25562 Pain in left knee: Secondary | ICD-10-CM

## 2019-11-14 NOTE — Telephone Encounter (Signed)
Is it ok to refill this med

## 2019-12-27 ENCOUNTER — Encounter: Payer: Self-pay | Admitting: Registered Nurse

## 2019-12-28 NOTE — Telephone Encounter (Signed)
Patient states she is still having some pain.  Please Advise.

## 2019-12-31 ENCOUNTER — Encounter: Payer: Self-pay | Admitting: Registered Nurse

## 2019-12-31 ENCOUNTER — Other Ambulatory Visit: Payer: Self-pay | Admitting: Registered Nurse

## 2019-12-31 DIAGNOSIS — M25559 Pain in unspecified hip: Secondary | ICD-10-CM

## 2020-01-09 ENCOUNTER — Other Ambulatory Visit: Payer: Self-pay | Admitting: Registered Nurse

## 2020-01-09 DIAGNOSIS — G4700 Insomnia, unspecified: Secondary | ICD-10-CM

## 2020-01-14 ENCOUNTER — Ambulatory Visit: Payer: BC Managed Care – PPO | Attending: Registered Nurse | Admitting: Physical Therapy

## 2020-01-14 ENCOUNTER — Other Ambulatory Visit: Payer: Self-pay

## 2020-01-14 ENCOUNTER — Encounter: Payer: Self-pay | Admitting: Physical Therapy

## 2020-01-14 DIAGNOSIS — M62830 Muscle spasm of back: Secondary | ICD-10-CM | POA: Insufficient documentation

## 2020-01-14 DIAGNOSIS — M5442 Lumbago with sciatica, left side: Secondary | ICD-10-CM | POA: Insufficient documentation

## 2020-01-14 NOTE — Therapy (Signed)
[facility] [address] [place], [place], [address] Phone: [phone]   Fax:  [phone]  Physical Therapy Evaluation  Patient Details  Name: Rebecca Grant MRN: [id number] Date of Birth: [birth year] Referring Provider (PT): Mariel Kansky Date: 01/14/2020  PT End of Session - 01/14/20 0952    Visit Number  1    Date for PT Re-Evaluation  03/15/20    PT Start Time  0930    PT Stop Time  1015    PT Time Calculation (min)  45 min    Activity Tolerance  Patient tolerated treatment well    Behavior During Therapy  [facility] for tasks assessed/performed       Past Medical History:  Diagnosis Date  . Anxiety   . Back ache   . Endometriosis    s/p hysterectomy  . Insomnia     Past Surgical History:  Procedure Laterality Date  . ABDOMINAL HYSTERECTOMY    . APPENDECTOMY      There were no vitals filed for this visit.   Subjective Assessment - 01/14/20 0932    Subjective  Patient reports that she has been having left hip and leg pain over the past 3 months much more consistently.  She is unsure of a reason for the increase of pain.,    Limitations  Walking;Standing;House hold activities;Sitting    Patient Stated Goals  have less pain    Currently in Pain?  Yes    Pain Score  5     Pain Location  Hip    Pain Orientation  Left    Pain Descriptors / Indicators  Burning;Stabbing    Pain Type  Acute pain    Pain Radiating Towards  left lateral thigh and buttock    Pain Onset  More than a month ago    Pain Frequency  Constant    Aggravating Factors   pain up to 9/10 with prolonged standing    Pain Relieving Factors  medicaiton changing position pain at best a 2/10    Effect of Pain on Daily Activities  just really limits standing and walking         [facility] PT Assessment - 01/14/20 0001      Assessment   Medical Diagnosis  left sciatica    Referring Provider (PT)  Kateri Plummer    Onset Date/Surgical Date  12/16/19     Prior Therapy  5 years ago for the same and it resolved       Precautions   Precautions  None      Balance Screen   Has the patient fallen in the past 6 months  No    Has the patient had a decrease in activity level because of a fear of falling?   No    Is the patient reluctant to leave their home because of a fear of falling?   No      Home Environment   Additional Comments  has stairs, does housework      Prior Function   Level of Independence  Independent    Vocation  Full time employment    Vocation Requirements  sitting and standing,    Leisure  has not exercised      Posture/Postural Control   Posture Comments  fwd head, rounded shoulders      ROM / Strength   AROM / PROM / Strength  AROM;Strength      AROM   Overall  AROM Comments  Lumbar flexion decreased 25%, extension decreaesd 100%, side bending decreased 75% all increased some pain      Strength   Overall Strength Comments  left hip 4/5, right hip 4+/5      Flexibility   Soft Tissue Assessment /Muscle Length  yes    Hamstrings  very tight and painful left SLR to 45 degrees    Piriformis  very painful and very tight on the left      Palpation   Palpation comment  she is tight and very tender in the left buttock, the left lateral thigh and in the lumbar parapsinals                Objective measurements completed on examination: See above findings.      [facility] Adult PT Treatment/Exercise - 01/14/20 0001      Modalities   Modalities  Electrical Stimulation;Moist Heat      Moist Heat Therapy   Number Minutes Moist Heat  12 Minutes    Moist Heat Location  Lumbar Spine;Hip      Electrical Stimulation   Electrical Stimulation Location  left gluteal and lateral thigh area    Electrical Stimulation Action  IFC    Electrical Stimulation Parameters  right sidelying    Electrical Stimulation Goals  Pain               PT Short Term Goals - 01/14/20 1027      PT SHORT TERM GOAL #1    Title  Patient will report 4/10 low back and sciatica at worst to allow for her to work with less pain    Time  2    Period  Weeks    Status  New        PT Long Term Goals - 01/14/20 1027      PT LONG TERM GOAL #1   Title  Patient will report 4/10 low back pain at worst and only intermittently, and no sciatica    Time  8    Period  Weeks    Status  New      PT LONG TERM GOAL #2   Title  increase lumbar ROM 50%    Time  8    Period  Weeks    Status  New      PT LONG TERM GOAL #3   Title  understand posture and body mechanics    Time  8    Period  Weeks    Status  New      PT LONG TERM GOAL #4   Title  tolerate grocery shopping wihtout any increase of her pain    Time  8    Period  Weeks    Status  New             Plan - 01/14/20 3825    Clinical Impression Statement  Patient reports that she started having left hip and sciatic type pain about 3 months ago, she is unsure of a cause, reports that she has had this before, we saw her for aobut 3-4 visits 5 years ago with good results.  She has very limited lumbar extension with pain, SLR was 45 degrees on the left with pain and the left piriformis was very tight and painful, her job is standing and walking.    Stability/Clinical Decision Making  Evolving/Moderate complexity    Clinical Decision Making  Low    Rehab Potential  Good    PT  Frequency  2x / week    PT Duration  8 weeks    PT Treatment/Interventions  ADLs/Self Care Home Management;Cryotherapy;Electrical Stimulation;Gait training;Traction;Moist Heat;Patient/family education;Therapeutic exercise;Therapeutic activities    PT Next Visit Plan  Slowly start exercises    Consulted and Agree with Plan of Care  Patient       Patient will benefit from skilled therapeutic intervention in order to improve the following deficits and impairments:  Decreased range of motion, Impaired tone, Increased muscle spasms, Decreased activity tolerance, Pain, Improper body  mechanics, Impaired flexibility, Decreased strength, Postural dysfunction  Visit Diagnosis: Acute left-sided low back pain with left-sided sciatica - Plan: PT plan of care cert/re-cert  Muscle spasm of back - Plan: PT plan of care cert/re-cert     Problem List Patient Active Problem List   Diagnosis Date Noted  . H/O: hysterectomy 06/03/2016  . Sciatica 06/03/2016  . Nocturia 03/19/2014  . Obesity (BMI 30-39.9) 03/19/2014  . Anxiety 03/08/2013  . Insomnia     Sumner Boast., PT 01/14/2020, 10:32 AM  [address] [place], [place], [address] Phone: [phone]   Fax:  [phone]  Name: Rebecca Grant MRN: [id number] Date of Birth: 1966-07-23

## 2020-01-14 NOTE — Patient Instructions (Signed)
Access Code: [email or web address] URL: [email or web address] Date: 01/14/2020 Prepared by: Stacie Glaze  Exercises Supine Lower Trunk Rotation - 1 x daily - 7 x weekly - 3 sets - 10 reps - 10 hold Supine Piriformis Stretch with Foot on Ground - 1 x daily - 7 x weekly - 3 sets - 10 reps - 15 hold Standing Lumbar Extension - 1 x daily - 7 x weekly - 3 sets - 10 reps - 1 hold

## 2020-01-23 ENCOUNTER — Encounter: Payer: BC Managed Care – PPO | Admitting: Physical Therapy

## 2020-01-30 ENCOUNTER — Encounter: Payer: BC Managed Care – PPO | Admitting: Physical Therapy

## 2020-01-31 ENCOUNTER — Institutional Professional Consult (permissible substitution): Payer: BC Managed Care – PPO | Admitting: Pulmonary Disease

## 2020-02-04 ENCOUNTER — Encounter: Payer: BC Managed Care – PPO | Admitting: Physical Therapy

## 2020-02-06 ENCOUNTER — Encounter: Payer: BC Managed Care – PPO | Admitting: Physical Therapy

## 2020-02-13 ENCOUNTER — Encounter: Payer: BC Managed Care – PPO | Admitting: Physical Therapy

## 2020-02-20 ENCOUNTER — Encounter: Payer: BC Managed Care – PPO | Admitting: Physical Therapy

## 2020-02-27 ENCOUNTER — Ambulatory Visit: Payer: BC Managed Care – PPO | Attending: Registered Nurse | Admitting: Physical Therapy

## 2020-03-05 ENCOUNTER — Encounter: Payer: BC Managed Care – PPO | Admitting: Physical Therapy

## 2020-03-12 ENCOUNTER — Encounter: Payer: BC Managed Care – PPO | Admitting: Physical Therapy

## 2020-03-20 ENCOUNTER — Encounter: Payer: Self-pay | Admitting: Registered Nurse

## 2020-03-20 DIAGNOSIS — H00014 Hordeolum externum left upper eyelid: Secondary | ICD-10-CM | POA: Diagnosis not present

## 2020-03-31 ENCOUNTER — Other Ambulatory Visit: Payer: Self-pay | Admitting: Registered Nurse

## 2020-03-31 DIAGNOSIS — M5430 Sciatica, unspecified side: Secondary | ICD-10-CM

## 2020-03-31 DIAGNOSIS — M25562 Pain in left knee: Secondary | ICD-10-CM

## 2020-03-31 NOTE — Telephone Encounter (Signed)
Patient is requesting a refill of the following medications: Requested Prescriptions   Pending Prescriptions Disp Refills  . meloxicam (MOBIC) 15 MG tablet [Pharmacy Med Name: MELOXICAM 15MG  TABLETS] 30 tablet 0    Sig: TAKE 1 TABLET(15 MG) BY MOUTH DAILY    Date of patient request: 03/31/2020 Last office visit:10/23/2019 Date of last refill: 11/14/2019 Last refill amount: 30 tablets  Follow up time period per chart: N/A

## 2020-04-16 ENCOUNTER — Other Ambulatory Visit: Payer: Self-pay | Admitting: Registered Nurse

## 2020-04-16 DIAGNOSIS — G4700 Insomnia, unspecified: Secondary | ICD-10-CM

## 2020-08-21 ENCOUNTER — Encounter: Payer: Self-pay | Admitting: *Deleted

## 2020-08-26 ENCOUNTER — Ambulatory Visit (INDEPENDENT_AMBULATORY_CARE_PROVIDER_SITE_OTHER): Payer: BC Managed Care – PPO

## 2020-08-26 ENCOUNTER — Other Ambulatory Visit: Payer: Self-pay

## 2020-08-26 ENCOUNTER — Encounter: Payer: Self-pay | Admitting: Registered Nurse

## 2020-08-26 ENCOUNTER — Ambulatory Visit: Payer: BC Managed Care – PPO | Admitting: Registered Nurse

## 2020-08-26 VITALS — BP 121/83 | HR 105 | Temp 98.0°F | Resp 18 | Ht 67.0 in | Wt 233.0 lb

## 2020-08-26 DIAGNOSIS — J9811 Atelectasis: Secondary | ICD-10-CM | POA: Diagnosis not present

## 2020-08-26 DIAGNOSIS — J90 Pleural effusion, not elsewhere classified: Secondary | ICD-10-CM | POA: Diagnosis not present

## 2020-08-26 DIAGNOSIS — J209 Acute bronchitis, unspecified: Secondary | ICD-10-CM | POA: Diagnosis not present

## 2020-08-26 DIAGNOSIS — R059 Cough, unspecified: Secondary | ICD-10-CM

## 2020-08-26 DIAGNOSIS — R0781 Pleurodynia: Secondary | ICD-10-CM

## 2020-08-26 DIAGNOSIS — R0989 Other specified symptoms and signs involving the circulatory and respiratory systems: Secondary | ICD-10-CM | POA: Diagnosis not present

## 2020-08-26 MED ORDER — PREDNISONE 10 MG PO TABS: ORAL_TABLET | ORAL | 0 refills | Status: AC

## 2020-08-26 MED ORDER — BENZONATATE 200 MG PO CAPS: 200.0000 mg | ORAL_CAPSULE | Freq: Two times a day (BID) | ORAL | 0 refills | Status: AC | PRN

## 2020-08-26 MED ORDER — AZITHROMYCIN 250 MG PO TABS: ORAL_TABLET | ORAL | 0 refills | Status: AC

## 2020-08-26 MED ORDER — GUAIFENESIN-DM 100-10 MG/5ML PO SYRP: 5.0000 mL | ORAL_SOLUTION | ORAL | 0 refills | Status: AC | PRN

## 2020-08-26 NOTE — Progress Notes (Signed)
Acute Office Visit  Subjective:    Patient ID: Rebecca Grant, female    DOB: 10/09/1966, 54 y.o.   MRN: [id number]  Chief Complaint  Patient presents with  . Fall    Patient states she has had an fall on 08/13/2020 and now is experiencing some constant pain on her left side especially under left breast. Pt has tried ibuprofen with no relief/    HPI Patient is in today for acute pain on L side Notes that she fell on 10.27.21 and landed on that side. Larey SeatFell hard enough to knock wind out of her. Since then has had L side pain. Ongoing. Now having cold symptoms. Congestion in upper and lower respiratory. Much coughing, productive, hears rattling in chest. Is painful to L side and ribs. Feels deeper. Denies much tenderness to palpation. No bruising. COVID test on Sunday, negative. Reports this was 4 days after symptom onset. Second dose of vaccine on 03/28/20.   Past Medical History:  Diagnosis Date  . Anxiety   . Back ache   . Endometriosis    s/p hysterectomy  . Insomnia     Past Surgical History:  Procedure Laterality Date  . ABDOMINAL HYSTERECTOMY    . APPENDECTOMY      Family History  Problem Relation Age of Onset  . Hypertension Mother   . Gout Maternal Grandmother   . Heart disease Maternal Grandfather   . Hypertension Maternal Grandfather   . Gout Maternal Grandfather   . Seizures Sister [id number]       undetermined etiology    Social History   Socioeconomic History  . Marital status: Divorced    Spouse name: n/a  . Number of children: 1  . Years of education: 1712  . Highest education level: Not on file  Occupational History  . Occupation: Control and instrumentation engineerassistant store manager    Employer: UNEMPLOYED  Tobacco Use  . Smoking status: Former Smoker    Years: 6.00    Types: Cigarettes  . Smokeless tobacco: Never Used  . Tobacco comment: smokes for stress relief, down to 1 cigarette daily  Vaping Use  . Vaping Use: Never used  Substance and Sexual Activity  . Alcohol use: No     Alcohol/week: 0.0 standard drinks  . Drug use: No  . Sexual activity: Never    Birth control/protection: Abstinence, Surgical  Other Topics Concern  . Not on file  Social History Narrative   Lives alone.   Social Determinants of Health   Financial Resource Strain:   . Difficulty of Paying Living Expenses: Not on file  Food Insecurity:   . Worried About Programme researcher, broadcasting/film/videounning Out of Food in the Last Year: Not on file  . Ran Out of Food in the Last Year: Not on file  Transportation Needs:   . Lack of Transportation (Medical): Not on file  . Lack of Transportation (Non-Medical): Not on file  Physical Activity:   . Days of Exercise per Week: Not on file  . Minutes of Exercise per Session: Not on file  Stress:   . Feeling of Stress : Not on file  Social Connections:   . Frequency of Communication with Friends and Family: Not on file  . Frequency of Social Gatherings with Friends and Family: Not on file  . Attends Religious Services: Not on file  . Active Member of Clubs or Organizations: Not on file  . Attends BankerClub or Organization Meetings: Not on file  . Marital Status: Not on file  Intimate  Partner Violence:   . Fear of Current or Ex-Partner: Not on file  . Emotionally Abused: Not on file  . Physically Abused: Not on file  . Sexually Abused: Not on file    Outpatient Medications Prior to Visit  Medication Sig Dispense Refill  . esomeprazole (NEXIUM) 40 MG capsule Take 1 capsule (40 mg total) by mouth daily at 12 noon. 30 capsule 3  . fluticasone (FLONASE) 50 MCG/ACT nasal spray Place 2 sprays into both nostrils daily. 48 g 3  . gabapentin (NEURONTIN) 100 MG capsule TAKE 1 CAPSULE(100 MG) BY MOUTH THREE TIMES DAILY 180 capsule 1  . meloxicam (MOBIC) 15 MG tablet TAKE 1 TABLET(15 MG) BY MOUTH DAILY 30 tablet 0  . traZODone (DESYREL) 50 MG tablet TAKE 1/2 TO 1 TABLET(25 TO 50 MG) BY MOUTH AT BEDTIME AS NEEDED FOR SLEEP 30 tablet 2  . albuterol (PROVENTIL HFA;VENTOLIN HFA) 108 (90 Base)  MCG/ACT inhaler Inhale 2 puffs into the lungs every 6 (six) hours as needed. (Patient not taking: Reported on 08/26/2020) 1 Inhaler 1  . cetirizine (ZYRTEC) 10 MG tablet Take 1 tablet (10 mg total) by mouth daily. (Patient not taking: Reported on 10/23/2019) 30 tablet 11  . cyclobenzaprine (FLEXERIL) 10 MG tablet Take 1 tablet (10 mg total) by mouth 3 (three) times daily as needed for muscle spasms. (Patient not taking: Reported on 10/23/2019) 30 tablet 0   No facility-administered medications prior to visit.    No Known Allergies  Review of Systems  Constitutional: Negative.   HENT: Negative.   Eyes: Negative.   Respiratory: Positive for cough, chest tightness, shortness of breath and wheezing.   Cardiovascular: Negative.   Gastrointestinal: Negative.   Genitourinary: Negative.   Musculoskeletal: Negative.   Skin: Negative.   Neurological: Negative.   Psychiatric/Behavioral: Negative.        Objective:    Physical Exam Vitals and nursing note reviewed.  Constitutional:      Appearance: Normal appearance. She is obese.  Cardiovascular:     Rate and Rhythm: Normal rate and regular rhythm.     Heart sounds: Normal heart sounds.  Pulmonary:     Effort: Pulmonary effort is normal. No respiratory distress.     Breath sounds: No stridor. Wheezing present. No rhonchi or rales.  Chest:     Chest wall: Tenderness present.  Musculoskeletal:        General: Normal range of motion.  Skin:    General: Skin is warm and dry.     Capillary Refill: Capillary refill takes less than 2 seconds.  Neurological:     General: No focal deficit present.     Mental Status: She is alert and oriented to person, place, and time. Mental status is at baseline.  Psychiatric:        Mood and Affect: Mood normal.        Thought Content: Thought content normal.        Judgment: Judgment normal.     BP 121/83   Pulse (!) 105   Temp 98 F (36.7 C) (Temporal)   Resp 18   Ht 5\' 7"  (1.702 m)   Wt 233 lb  (105.7 kg)   SpO2 95%   BMI 36.49 kg/m  Wt Readings from Last 3 Encounters:  08/26/20 233 lb (105.7 kg)  10/23/19 238 lb 6.4 oz (108.1 kg)  09/04/19 242 lb (109.8 kg)    There are no preventive care reminders to display for this patient.  There are no  preventive care reminders to display for this patient.   Lab Results  Component Value Date   TSH 0.549 03/19/2014   Lab Results  Component Value Date   WBC 9.1 07/11/2017   HGB 15.2 (H) 07/11/2017   HCT 45.2 07/11/2017   MCV 96.2 07/11/2017   PLT 221 07/11/2017   Lab Results  Component Value Date   NA 140 07/11/2017   K 3.7 07/11/2017   CO2 22 07/11/2017   GLUCOSE 152 (H) 07/11/2017   BUN 10 07/11/2017   CREATININE 0.80 07/11/2017   BILITOT 0.4 03/19/2014   ALKPHOS 69 03/19/2014   AST 18 03/19/2014   ALT 19 03/19/2014   PROT 6.8 03/19/2014   ALBUMIN 4.3 03/19/2014   CALCIUM 8.9 07/11/2017   ANIONGAP 7 07/11/2017   Lab Results  Component Value Date   CHOL 143 03/19/2014   Lab Results  Component Value Date   HDL 26 (L) 03/19/2014   Lab Results  Component Value Date   LDLCALC 103 (H) 03/19/2014   Lab Results  Component Value Date   TRIG 72 03/19/2014   Lab Results  Component Value Date   CHOLHDL 5.5 03/19/2014   No results found for: HGBA1C     Assessment & Plan:   Problem List Items Addressed This Visit    None    Visit Diagnoses    Rib pain    -  Primary   Relevant Orders   DG Chest 2 View (Completed)   Cough       Relevant Orders   DG Chest 2 View (Completed)   Chest congestion       Relevant Orders   DG Chest 2 View (Completed)   Acute bronchitis, unspecified organism       Relevant Medications   guaiFENesin-dextromethorphan (ROBITUSSIN DM) 100-10 MG/5ML syrup   benzonatate (TESSALON) 200 MG capsule   azithromycin (ZITHROMAX) 250 MG tablet   predniSONE (DELTASONE) 10 MG tablet       Meds ordered this encounter  Medications  . guaiFENesin-dextromethorphan (ROBITUSSIN DM)  100-10 MG/5ML syrup    Sig: Take 5 mLs by mouth every 4 (four) hours as needed for cough.    Dispense:  118 mL    Refill:  0    Order Specific Question:   Supervising Provider    Answer:   Neva Seat, JEFFREY R [[id number]]  . benzonatate (TESSALON) 200 MG capsule    Sig: Take 1 capsule (200 mg total) by mouth 2 (two) times daily as needed for cough.    Dispense:  20 capsule    Refill:  0    Order Specific Question:   Supervising Provider    Answer:   Neva Seat, JEFFREY R [[id number]]  . azithromycin (ZITHROMAX) 250 MG tablet    Sig: Take 2 tabs on first day. Then take 1 tab daily. Finish entire supply.    Dispense:  6 tablet    Refill:  0    Order Specific Question:   Supervising Provider    Answer:   Neva Seat, JEFFREY R [[id number]]  . predniSONE (DELTASONE) 10 MG tablet    Sig: Take 3 tablets (30 mg total) by mouth daily with breakfast for 3 days, THEN 2 tablets (20 mg total) daily with breakfast for 3 days, THEN 1 tablet (10 mg total) daily with breakfast for 3 days.    Dispense:  18 tablet    Refill:  0    Order Specific Question:   Supervising Provider    Answer:  GREENE, JEFFREY R [[id number]]   PLAN  DG chest shows bronchiole thickening and some atelectasis. Suspicious for bronchitis vs asthma exacerbation. No osseous changes. Believe rib pain to originate from fall but to have become perpetuated by coughing.  Pt slightly tachy, O2 sat mildly low to 95%. Borderline but will give abx to cover for bacterial infection  Prednisone taper. Continue inhaler prn  Tessalon and robitussin dm for symptom management  Return PRN  Patient encouraged to call clinic with any questions, comments, or concerns.   Janeece Agee, NP

## 2020-08-26 NOTE — Patient Instructions (Addendum)
Ms Rebecca Grant -   It appears you have bronchitis.  A steroid taper will help with inflammation A z-pack will help cover for any bacteria.  Robitussin DM and tessalon will help with symptoms  Please call the office if your symptoms worsen or fail to improve  I do not think you have COVID, the Flu, or pneumonia at this time.  Thank you  Jari Sportsman, NP    If you have lab work done today you will be contacted with your lab results within the next 2 weeks.  If you have not heard from [place] then please contact us. The fastest way to get your results is to register for My Chart.   IF you received an x-ray today, you will receive an invoice from [facility]. Please contact [facility] at [phone] with questions or concerns regarding your invoice.   IF you received labwork today, you will receive an invoice from [facility]. Please contact [facility] at [phone] with questions or concerns regarding your invoice.   Our billing staff will not be able to assist you with questions regarding bills from these companies.  You will be contacted with the lab results as soon as they are available. The fastest way to get your results is to activate your My Chart account. Instructions are located on the last page of this paperwork. If you have not heard from [place] regarding the results in 2 weeks, please contact this office.

## 2020-09-08 ENCOUNTER — Other Ambulatory Visit: Payer: Self-pay | Admitting: Registered Nurse

## 2020-09-08 DIAGNOSIS — K219 Gastro-esophageal reflux disease without esophagitis: Secondary | ICD-10-CM

## 2020-09-14 ENCOUNTER — Encounter: Payer: Self-pay | Admitting: Registered Nurse

## 2020-09-15 NOTE — Telephone Encounter (Signed)
Pt would need new referral if you'd like her to have a new sleep study.

## 2020-12-06 DIAGNOSIS — M545 Low back pain, unspecified: Secondary | ICD-10-CM | POA: Diagnosis not present
